# Patient Record
Sex: Female | Born: 1980 | Race: Black or African American | Hispanic: No | Marital: Married | Smoking: Never smoker
Health system: Southern US, Community
[De-identification: ages and names within clinical notes are randomized; demographics above are authoritative.]

## PROBLEM LIST (undated history)

## (undated) ENCOUNTER — Inpatient Hospital Stay (HOSPITAL_COMMUNITY): Payer: Self-pay

## (undated) DIAGNOSIS — D649 Anemia, unspecified: Secondary | ICD-10-CM

## (undated) HISTORY — PX: NO PAST SURGERIES: SHX2092

---
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
## 1999-12-27 ENCOUNTER — Inpatient Hospital Stay (HOSPITAL_COMMUNITY): Admission: AD | Admit: 1999-12-27 | Discharge: 1999-12-27 | Payer: Self-pay | Admitting: Obstetrics & Gynecology

## 2000-01-08 ENCOUNTER — Other Ambulatory Visit: Admission: RE | Admit: 2000-01-08 | Discharge: 2000-01-08 | Payer: Self-pay | Admitting: Obstetrics and Gynecology

## 2001-03-05 ENCOUNTER — Ambulatory Visit (HOSPITAL_COMMUNITY): Admission: AD | Admit: 2001-03-05 | Discharge: 2001-03-05 | Payer: Self-pay | Admitting: Obstetrics & Gynecology

## 2001-03-05 ENCOUNTER — Encounter: Payer: Self-pay | Admitting: Obstetrics & Gynecology

## 2007-10-15 ENCOUNTER — Inpatient Hospital Stay (HOSPITAL_COMMUNITY): Admission: AD | Admit: 2007-10-15 | Discharge: 2007-10-15 | Payer: Self-pay | Admitting: Obstetrics and Gynecology

## 2007-10-15 ENCOUNTER — Inpatient Hospital Stay (HOSPITAL_COMMUNITY): Admission: AD | Admit: 2007-10-15 | Discharge: 2007-10-19 | Payer: Self-pay | Admitting: Obstetrics and Gynecology

## 2010-04-06 ENCOUNTER — Emergency Department (HOSPITAL_COMMUNITY): Payer: Commercial Managed Care - PPO

## 2010-04-06 ENCOUNTER — Emergency Department (HOSPITAL_COMMUNITY)
Admission: EM | Admit: 2010-04-06 | Discharge: 2010-04-06 | Disposition: A | Discharge status: Home / Self Care | Payer: Commercial Managed Care - PPO | Attending: Emergency Medicine | Admitting: Emergency Medicine

## 2010-04-06 DIAGNOSIS — O99019 Anemia complicating pregnancy, unspecified trimester: Secondary | ICD-10-CM | POA: Insufficient documentation

## 2010-04-06 DIAGNOSIS — D649 Anemia, unspecified: Secondary | ICD-10-CM | POA: Insufficient documentation

## 2010-04-06 DIAGNOSIS — O208 Other hemorrhage in early pregnancy: Secondary | ICD-10-CM | POA: Insufficient documentation

## 2010-04-06 DIAGNOSIS — O200 Threatened abortion: Secondary | ICD-10-CM | POA: Insufficient documentation

## 2010-04-06 LAB — CBC
HCT: 33.5 % — ABNORMAL LOW (ref 36.0–46.0)
Hemoglobin: 11.3 g/dL — ABNORMAL LOW (ref 12.0–15.0)
MCH: 27 pg (ref 26.0–34.0)
MCHC: 33.7 g/dL (ref 30.0–36.0)
MCV: 80 fL (ref 78.0–100.0)
Platelets: 325 10*3/uL (ref 150–400)
RBC: 4.19 MIL/uL (ref 3.87–5.11)
RDW: 15.1 % (ref 11.5–15.5)
WBC: 7.9 10*3/uL (ref 4.0–10.5)

## 2010-04-06 LAB — GC/CHLAMYDIA PROBE AMP, GENITAL: Chlamydia, DNA Probe: NEGATIVE

## 2010-04-06 LAB — URINE MICROSCOPIC-ADD ON

## 2010-04-06 LAB — HCG, QUANTITATIVE, PREGNANCY: hCG, Beta Chain, Quant, S: 68796 m[IU]/mL — ABNORMAL HIGH (ref <5)

## 2010-04-06 LAB — WET PREP, GENITAL: Clue Cells Wet Prep HPF POC: NONE SEEN

## 2010-04-06 LAB — URINALYSIS, ROUTINE W REFLEX MICROSCOPIC
Bilirubin Urine: NEGATIVE
Ketones, ur: NEGATIVE mg/dL
Leukocytes, UA: NEGATIVE
Nitrite: NEGATIVE
Protein, ur: NEGATIVE mg/dL
Specific Gravity, Urine: 1.013 (ref 1.005–1.030)
Urine Glucose, Fasting: NEGATIVE mg/dL
Urobilinogen, UA: 0.2 mg/dL (ref 0.0–1.0)
pH: 5.5 (ref 5.0–8.0)

## 2010-04-06 LAB — POCT PREGNANCY, URINE: Preg Test, Ur: POSITIVE

## 2010-04-06 LAB — ABO/RH: ABO/RH(D): O POS

## 2010-04-08 ENCOUNTER — Inpatient Hospital Stay (HOSPITAL_COMMUNITY): Payer: Commercial Managed Care - PPO

## 2010-04-08 ENCOUNTER — Inpatient Hospital Stay (HOSPITAL_COMMUNITY)
Admission: AD | Admit: 2010-04-08 | Discharge: 2010-04-08 | Disposition: A | Discharge status: Home / Self Care | Payer: Commercial Managed Care - PPO | Source: Ambulatory Visit | Attending: Obstetrics & Gynecology | Admitting: Obstetrics & Gynecology

## 2010-04-08 ENCOUNTER — Other Ambulatory Visit: Payer: Self-pay | Admitting: Obstetrics and Gynecology

## 2010-04-08 DIAGNOSIS — O039 Complete or unspecified spontaneous abortion without complication: Secondary | ICD-10-CM | POA: Insufficient documentation

## 2010-04-08 LAB — CBC
HCT: 34.2 % — ABNORMAL LOW (ref 36.0–46.0)
Hemoglobin: 11.5 g/dL — ABNORMAL LOW (ref 12.0–15.0)
MCV: 80.3 fL (ref 78.0–100.0)
RBC: 4.26 MIL/uL (ref 3.87–5.11)
RDW: 15.3 % (ref 11.5–15.5)
WBC: 5.3 10*3/uL (ref 4.0–10.5)

## 2010-04-12 LAB — CROSSMATCH
Antibody Screen: NEGATIVE
Unit division: 0

## 2010-04-26 ENCOUNTER — Encounter: Payer: Commercial Managed Care - PPO | Admitting: Obstetrics and Gynecology

## 2010-04-26 ENCOUNTER — Encounter: Payer: Self-pay | Admitting: Obstetrics and Gynecology

## 2010-04-26 DIAGNOSIS — O039 Complete or unspecified spontaneous abortion without complication: Secondary | ICD-10-CM

## 2010-04-26 LAB — CONVERTED CEMR LAB: hCG, Beta Chain, Quant, S: 31.8 milliintl units/mL

## 2010-05-14 ENCOUNTER — Encounter (INDEPENDENT_AMBULATORY_CARE_PROVIDER_SITE_OTHER): Payer: Self-pay | Admitting: *Deleted

## 2010-05-14 ENCOUNTER — Other Ambulatory Visit: Payer: Commercial Managed Care - PPO

## 2010-05-14 DIAGNOSIS — Z0189 Encounter for other specified special examinations: Secondary | ICD-10-CM

## 2010-05-14 LAB — CONVERTED CEMR LAB: hCG, Beta Chain, Quant, S: <2 milliintl units/mL

## 2010-05-25 NOTE — Progress Notes (Unsigned)
Connie Benson, Connie Benson NO.:  [id number]  MEDICAL RECORD NO.:  [id number]           PATIENT TYPE:  A  LOCATION:  [facility]                   FACILITY:  [facility]  PHYSICIAN:  Connie Donovan, MD        DATE OF BIRTH:  Jun 12, 1980  DATE OF SERVICE:  04/26/2010                                 CLINIC NOTE  The patient is a 30 year old [place] African lady from [place] who has been in this country since she was 30 years old.  She was in the [facility] beginning of February and had a miscarriage spontaneous 9-week pregnancy and has had no bleeding since.  At that time, they started to give her a shot of Depo-Provera, but her intention is to get pregnant again.  She has one baby.  She is a gravida 2, para 1-0-1-1 and her other child is 16 years old.  She is on vitamins with folic acid.  I told her that she can start trying get pregnant after April 2012 when the Depo-Provera wears off and it looks as if her miscarriage was complete.  She did a Cytotec. I am going to get a quantitative beta on her today to make sure it is down to 0 and if not we will recommend getting another ultrasound. Interestingly enough, she was living in [place] with her parents, a Muslim and her father picked a husband for her who lived down here and that is why she moved down here about 10 years ago.  They were married for 8 years.  He told her that she was a barren woman because she had no children and did not get pregnant.  He wanted divorce that was granted and she has met her recent partner and the father of her baby with whom she has a very good relationship.  She is doing very well.  IMPRESSION:  Complete abortion.  PLAN:  Pregnancy soon.          ______________________________ Connie Donovan, MD    PR/MEDQ  D:  04/26/2010  T:  04/27/2010  Job:  [id number]

## 2010-07-17 NOTE — Op Note (Signed)
NAMECLAY, MENSER                ACCOUNT NO.:  [id number]   MEDICAL RECORD NO.:  [id number]          PATIENT TYPE:  INP   LOCATION:  9114                          [facility]   PHYSICIAN:  Hal Morales, M.D.DATE OF BIRTH:  05-Oct-1980   DATE OF PROCEDURE:  10/17/2007  DATE OF DISCHARGE:                               OPERATIVE REPORT   PREOPERATIVE DIAGNOSES:  1. Intrauterine pregnancy at term.  2. Premature rupture of membranes.  3. Prolonged second stage of labor.  4. Occiput posterior position.   POSTOPERATIVE DIAGNOSES:  1. Intrauterine pregnancy at term.  2. Premature rupture of membranes.  3. Prolonged second stage of labor.  4. Occiput posterior position.  5. Nuchal cord and body cord and meconium-stained fluid.   OPERATION:  Vacuum-assisted vaginal delivery over intact perineum,  repair of second-degree midline laceration.   SURGEON:  Hal Morales, MD   ANESTHESIA:  Epidural.   ESTIMATED BLOOD LOSS:  Less than 500 mL.   COMPLICATIONS:  None.   FINDINGS:  The patient was delivered of a female infant whose name is  Kassim, weighing 7 pounds 1 ounce with Apgars of 7 and 9 at 1 and 5  minutes respectively.   PROCEDURE:  The patient had been pushing in the lithotomy position for  over 2 hours.  She had push the baby down to +2 to +3 station but the  infant remained occiput posterior.  A discussion was held with the  patient concerning options for management of her labor.  These options  included continued pushing, cesarean section, and vacuum-assisted  vaginal delivery.  The risks of each of these options was outlined in  detail including the risks of anesthesia, bleeding, infection, damage to  adjacent organs for cesarean section and the risks of damage to maternal  tissues, cephalohematoma, inability to effect vaginal delivery and the  ability to effect delivery of the head and subsequent shoulder dystocia  sometimes requiring Zavanelli maneuver for the  vacuum assisted vaginal  delivery.  The risks of continued obstructive labor for continued  pushing was reviewed.  After consideration of the options, the risks and  benefits, the patient and her family decided they wanted to proceed with  an attempt at vacuum-assisted vaginal delivery.  With the patient in  lithotomy position, the perineum was prepped.  The bladder was emptied  with a red Robinson catheter.  A kiwi vacuum extractor was used over the  next 3 contractions to allow delivery of the fetal vertex in the occiput  posterior position over an intact perineum.  A loose nuchal cord was  reduced and the remainder of the infant delivered with a combination of  maternal expulsive efforts and gentle traction as the remainder of the  cord wrapped around the body was reduced.  The cord was clamped and cut  and the infant placed in the isolette.  The placenta spontaneously  separated and was expelled with maternal expulsive efforts.  It was  given to the employees of [employer] for cord blood  collection.  The midline laceration was repaired in  a layered fashion  with 2-0 Vicryl.  Ice packs were placed on the perineum.  The mother was  left in the labor delivery recovery area for initial infant bonding.  She did expressed a desire for circumcision for her son.  The risks of  anesthesia, bleeding and damage to the penis were explained to her and  she wishes to have that done at the appropriate time during the baby's  hospitalization.      Hal Morales, M.D.  Electronically Signed     VPH/MEDQ  D:  10/17/2007  T:  10/17/2007  Job:  [id number]

## 2010-07-17 NOTE — Discharge Summary (Signed)
Connie Benson, Connie Benson                ACCOUNT NO.:  [id number]   MEDICAL RECORD NO.:  [id number]          PATIENT TYPE:  INP   LOCATION:  9114                          [facility]   PHYSICIAN:  Janine Limbo, M.D.DATE OF BIRTH:  Jun 02, 1980   DATE OF ADMISSION:  10/15/2007  DATE OF DISCHARGE:  10/19/2007                               DISCHARGE SUMMARY   ADMITTING DIAGNOSES:  1. Intrauterine pregnancy at 39 weeks.  2. Premature rupture of membranes at term, very light meconium-stained      fluid.  3. Not in labor.   DISCHARGE DIAGNOSES:  1. Status post a vacuum assisted vaginal delivery by Dr. Pennie Rushing on      October 17, 2007 at 1:14 a.m.  Findings were a viable female infant by      the name of Kissim with Apgars 7 at 1 minute and 9 at 5 minutes and      weight of 7 pounds 1 ounce (which was 3210 g), length 21-1/2      inches.  She is breast-feeding and bottle feeding.  2. Some socioeconomic issues related to car seat and employment.  No      pay while on her leave as well as support from father of the baby      regarding finances.   HOSPITAL COURSE:  Ms. Frazier Butt is a 30 year old gravida 1, para 0 at 71  weeks' gestation who presented on the date of her admission which was  August the 13th with premature rupture of membranes which occurred at  approximately 2300.  She denied any contractions.  Reported good fetal  movement.  Previously had been followed by nurse midwife service at [facility]  and history was remarkable for:   1. Sickle cell trait.  2. First trimester bleeding.  3. Group beta strep negative.   She was admitted and offered the options of:   1. Expectant management versus:  2. Pitocin augmentation, and the patient initially elected for      expectant management.  Her cervix on admission was 2-3 cm, 90%, -3      in vertex.  At 4 a.m. on the 14th the patient was beginning to feel      more discomfort with contractions.  Her cervix at that time was 3-      1/2, 90%,  -2 vertex.  Fetal heart tracing was reassuring.      Contractions were irregular every 3-4 minutes.  Declined any pain      medicine at that time.  At 8:30 a.m. on the 14th she continued to      decline augmentation.  However, at that point had received 2 doses      of Stadol.  At 10 a.m. she was becoming tearful with her      contractions.  They were every 6-7 minutes.  Fetal heart rate      remained reassuring overall with occasional mild variable.  She      continued to decline epidural.  Plan was made to begin Pitocin      augmentation at noon if no  change.  Pitocin was started just after      12:15 secondary to no cervical change.  Later on that evening the      patient did decide to proceed with an epidural.  It had been      recommended to her earlier, but she had declined it at several      points.  Cervix at 5 p.m. was 5, 90 and -1.  Contractions were      every 2-6 minutes.  Occasional mild variable but CST remained      overall reassuring.  Her Pitocin was at 12 mU per minute.  At      approximately 6 p.m. on the 14th the patient was comfortable with      her epidural.  She did have an intrauterine pressure catheter and      fetal scalp electrode placed.  She was still having occasional very      mild variables.  Cervix was 5+, 80% vertex and +1.  Pitocin was on      14 mU per minute, and it was noted after [facility] put in that      [place] Units  were inadequate.  At approximately 8 o'clock on      the 14th the patient was sleeping.  No signs or symptoms of      distress or discomfort.  Vital signs remained stable.  Fetal heart      rate was in the 130s, moderate variability reactive around at the      last point at 1930 with a very occasional mild variable.  Uterine      contractions persisted every 1/2 to 4 minutes in the usual 130-170.      Since about 6:30 Pitocin was at 22 mU/minute.  Cervical exam was      deferred to allow patient to rest, and plan was to continue to       titrate Pitocin to achieve adequate MVU's greater than or equal to      180.  At 2201 the patient's cervix was complete and +2.  Fetal      heart rate was reassuring with moderate variability reactive, very      mild occasional variables.  Contractions were every 1-1/2 to 5      minutes on 28 mU of Pitocin.  The patient was without pressure or      urge to push, and plan was made to allow her to labor down for a      short time while awaiting her sister's arrival.  At approximately      15 after midnight the patient had been pushing since 10:45.  She      had great pushing efforts, but was becoming frustrated with minimal      progress over the last 30 minutes and sister was inquiring about      any assistance with delivery.  Notable part of fetal head was seen      at introitus with her pushing effort.  She did have some variables      with her contractions with pushing, but overall fetal heart tracing      was reassuring.  She was afebrile.  Vital signs were stable.  The      patient was +1 to +2.  The patient had pushed on both her left and      right sides in a flat supine position as well as utilizing      squatting  bar.  Pitocin was at 24 mU/minute and contractions were      every 2-3 minutes.  Dr. Pennie Rushing was called and asked to assess if      vacuum or forceps assisted delivery was appropriate.  OP      presentation was suspected.  The patient was left resting on her      left side while awaiting Dr. Lilian Coma arrival.  Dr. Pennie Rushing did      come in and assessed options for management of delivery.  Dr.      Pennie Rushing did offer the patient to proceed with:   1. Continued pushing without any assistance.  2. A vacuum assisted vaginal delivery.  3. Proceeding with a primary low transverse cesarean section.   Risk and benefits of all these options were discussed with patient, and  patient did elect for vacuum-assisted vaginal delivery.  Kiwi vacuum  extractor was utilized by Dr.  Pennie Rushing, and over the next 3 contractions  vacuum extraction delivery was performed by Dr. Pennie Rushing.  Fetal vertex  and occiput posterior position.  Delivery was at 1:14 on the 15th of  August.  A viable female infant by the name of Kissim weighing 7 pounds 1  ounce which was 3210 grams.  Apgars were 7 at 1 minute and 9 at 5  minutes.  Did have a loose nuchal cord and body cord which Dr. Pennie Rushing  delivered through.  The patient did have a 2nd degree midline laceration  which Dr. Pennie Rushing repaired.  Infant went to full-term nursery.  Patient  was doing well status post delivery in L&D recovery.   By postpartum day #1 the patient was doing well.  She was breast and  bottle feeding.  She was up ad lib.  Vital signs were stable.  She was  afebrile.  Hemoglobin was down to 10.7 from 13.  White count was up to  10.5 from 5.8, platelets were 210 down from 224.  Physical exam was  within normal limits.  Abdomen was soft, nontender.  Fundus was firm.  She had scant lochia.  Perineum was healing.  Plan was to continue  routine postpartum care.  The patient did receive a psychosocial  assessment from social work regarding patient not having a car seat.  She as well is not receiving any pay while she is on maternity leave.  Social work talked with patient regarding obtaining car seat, and the  patient was to see if family or her sister may have car seat for her to  borrow.  If not she would let the social work know she needed car seat  assistance if she could not afford one from the store secondary to  single parent and no income while on her maternity leave.  By postpartum  day #2 the patient was doing well, ready for discharge.  She continued  breast and bottle feeding.  She was also pumping.  She was up ad lib.  Pain was controlled with Motrin and p.r.n. Percocet.  She is undecided  on birth control, but has desire for next pregnancy in approximately 2  years.  She still voiced this morning that she  did not have a car seat  available, that her sister did not have any to give her, and the  patient's RN notified.  She is voiding without difficulty, tolerating  regular diet, and did request to have some additional pain medicine  besides the Motrin for her discharge.  Vital signs were still stable.  She was afebrile.  Physical exam was within normal limits.  Fundus was  firm below umbilicus.  Perineum healing.  Lochia small rubra.  Extremities within normal limits.  The patient was deemed to have  received full benefit of her hospital stay, and was discharged home in  stable condition.  Discharge follow-up to occur in 6 weeks or as needed  at [facility].   DISCHARGE MEDICATIONS:  1. Prenatal vitamin 1 tab p.o. daily.  2. Motrin 600 mg p.o. q.6 h. p.r.n. pain.  3. Vicodin 1-2 tabs p.o. q.6 h. p.r.n. moderate to severe pain.  She      was dispensed 30 with no refills.   Postpartum instructions were per [facility] pamphlet and warning signs and  symptoms report were reviewed.      Candice Denny Levy, CNM      ______________________________  Janine Limbo, M.D.    CHS/MEDQ  D:  10/19/2007  T:  10/19/2007  Job:  [id number]

## 2010-07-17 NOTE — H&P (Signed)
NAMERAYLENE, CARMICKLE                ACCOUNT NO.:  [id number]   MEDICAL RECORD NO.:  [id number]          PATIENT TYPE:  INP   LOCATION:  9168                          [facility]   PHYSICIAN:  Janine Limbo, M.D.DATE OF BIRTH:  [birth year]   DATE OF ADMISSION:  10/15/2007  DATE OF DISCHARGE:                              HISTORY & PHYSICAL   This is a 30 year old gravida 1, para 0, at 39-0/7 weeks who presents  with ruptured membranes at 2300 hours.  She denies contractions and  reports positive fetal movement.  Pregnancy has been followed by the  Nurse-Midwife Service and remarkable for:  1. Sickle cell trait.  2. First trimester bleeding.  3. Group B strep negative.   ALLERGIES:  None.   OB HISTORY:  None.   MEDICAL HISTORY:  None.   SURGICAL HISTORY:  Negative.   FAMILY HISTORY:  Remarkable for mother with hypertension, sister and  brother with anemia, and mother with asthma.   GENETIC HISTORY:  Negative.   SOCIAL HISTORY:  The patient is single.  Father of the baby is not  involved.  She is of the Muslim faith.  She denies any alcohol, tobacco,  or drug use.  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   PRENATAL LABS:  Hemoglobin 13.3, platelets 335, blood type O positive,  antibody screen negative, sickle cell positive for trait.  RPR non-  reactive.  Rubella immune.  Hepatitis negative.  HIV negative.  Gonorrhea negative.  Chlamydia negative.  Cystic fibrosis negative.   HISTORY OF CURRENT PREGNANCY:  The patient entered care at [redacted] weeks  gestation.  Had her new OB workup at 11 weeks.  She had her first  trimester screen that was normal.  She reported that the father of the  baby was negative for sickle cell trait.  She had some round ligament  pain at 26 weeks.  On ultrasound, her anatomy was normal.  She was group  B strep negative at term.   OBJECTIVE DATA:  VITAL SIGNS:  Stable, afebrile.  HEENT:  Within normal limits.  Thyroid normal,  not enlarged.  CHEST:  Clear to auscultation.  HEART:  Regular rate and rhythm.  ABDOMEN:  Gravid, vertex to Leopold's.  EFM shows nonreactive fetal  heart rate with occasional contractions.  CERVIX:  2-3 cm, 90% effaced, -3 station with a vertex presentation.  She is leaking large amounts of light yellow fluid.  EXTREMITIES:  Within normal limits.   ASSESSMENT:  1. Intrauterine pregnancy at 39-0/7 weeks.  2. Premature ruptured membranes at term, light meconium stained fluid.  3. Not in labor.   PLAN:  1. Admit per Dr. Stefano Gaul.  2. Discussed expected management versus Pitocin augmentation.  The      patient elects expected management for now.      Marie L. Mayford Knife, C.N.M.      ______________________________  Janine Limbo, M.D.    MLW/MEDQ  D:  10/16/2007  T:  10/16/2007  Job:  [id number]

## 2010-08-23 ENCOUNTER — Ambulatory Visit: Payer: Commercial Managed Care - PPO | Admitting: Obstetrics and Gynecology

## 2010-11-30 LAB — CBC
Hemoglobin: 13
MCHC: 33.4
Platelets: 224
RDW: 14.8

## 2010-12-17 ENCOUNTER — Inpatient Hospital Stay (HOSPITAL_COMMUNITY)
Admission: AD | Admit: 2010-12-17 | Discharge: 2010-12-17 | Disposition: A | Discharge status: Home / Self Care | Payer: Commercial Managed Care - PPO | Source: Ambulatory Visit | Attending: Obstetrics & Gynecology | Admitting: Obstetrics & Gynecology

## 2010-12-17 ENCOUNTER — Encounter (HOSPITAL_COMMUNITY): Payer: Self-pay

## 2010-12-17 DIAGNOSIS — Z3201 Encounter for pregnancy test, result positive: Secondary | ICD-10-CM | POA: Insufficient documentation

## 2010-12-17 HISTORY — DX: Anemia, unspecified: D64.9

## 2010-12-17 LAB — POCT PREGNANCY, URINE: Preg Test, Ur: POSITIVE

## 2010-12-17 NOTE — Progress Notes (Signed)
[facility] and need a confirmation pregnancy for Medicaid. Pt states she is not having any problems but was told to come to [facility] for confirmation.

## 2010-12-17 NOTE — ED Provider Notes (Signed)
Attestation of Attending Supervision of Advanced Practitioner: Evaluation and management procedures were performed by the PA/NP/CNM/OB Fellow under my supervision/collaboration. Chart reviewed and agree with management and plan.  Duchess Armendarez A 12/17/2010 12:04 PM

## 2010-12-17 NOTE — ED Provider Notes (Signed)
History     Chief Complaint  Patient presents with  . Possible Pregnancy   HPI Pt states [facility], was told that she should come here for pregnancy verification to apply for medicaid. No problems, no pain or bleeding. Patient's last menstrual period was 11/03/2010.  OB History    Grav Para Term Preterm Abortions TAB SAB Ect Mult Living   2 1 1  1  1   1       Past Medical History  Diagnosis Date  . Anemia     No past surgical history on file.  Family History  Problem Relation Age of Onset  . Hypertension Mother   . Asthma Mother     History  Substance Use Topics  . Smoking status: Never Smoker   . Smokeless tobacco: Not on file  . Alcohol Use: No    Allergies: Allergies not on file  No prescriptions prior to admission    Review of Systems  Constitutional: Negative.   Respiratory: Negative.   Cardiovascular: Negative.   Gastrointestinal: Negative.   Genitourinary: Negative.    Physical Exam   Blood pressure 126/85, pulse 79, temperature 98.2 F (36.8 C), resp. rate 20, height 5\' 6"  (1.676 m), weight 126.281 kg (278 lb 6.4 oz), last menstrual period 11/03/2010, SpO2 99.00%.  Physical Exam  Constitutional: She is oriented to person, place, and time. She appears well-developed and well-nourished. No distress.  Cardiovascular: Normal rate.   Respiratory: Effort normal.  Musculoskeletal: Normal range of motion.  Neurological: She is alert and oriented to person, place, and time.  Psychiatric: She has a normal mood and affect.    MAU Course  Procedures  Results for orders placed during the hospital encounter of 12/17/10 (from the past 24 hour(s))  POCT PREGNANCY, URINE     Status: Normal   Collection Time   12/17/10  9:52 AM      Component Value Range   Preg Test, Ur POSITIVE       Assessment and Plan  Pregnancy verification letter given Follow up for prenatal care asap  [facility] 12/17/2010, 9:53 AM

## 2011-02-12 ENCOUNTER — Other Ambulatory Visit (HOSPITAL_COMMUNITY): Payer: Self-pay | Admitting: Family

## 2011-02-12 DIAGNOSIS — Z3689 Encounter for other specified antenatal screening: Secondary | ICD-10-CM

## 2011-02-12 LAB — OB RESULTS CONSOLE HIV ANTIBODY (ROUTINE TESTING): HIV: NONREACTIVE

## 2011-02-12 LAB — OB RESULTS CONSOLE GC/CHLAMYDIA
Chlamydia: NEGATIVE
Gonorrhea: NEGATIVE

## 2011-03-05 NOTE — L&D Delivery Note (Signed)
Attestation of Attending Supervision of Resident: Evaluation and management procedures were performed by the [facility] Resident under my supervision.  I have seen and examined the patient, reviewed the resident's note and chart, and I agree with management and plan.   Jaynie Collins, M.D. 08/16/2011 3:10 PM

## 2011-03-05 NOTE — L&D Delivery Note (Signed)
Delivery Note At 3:43 AM a viable female was delivered via Vaginal, Spontaneous Delivery (Presentation: Left Occiput Anterior).  APGAR: 8, 9; weight 6 lb 4.9 oz (2860 g).   Placenta status: Intact, Spontaneous.  Cord: 3 vessels with the following complications: None.    Anesthesia: Epidural  Episiotomy: None Lacerations: 1st degree, hemostatic, did not require repair Est. Blood Loss (mL): 250  Mom to postpartum.  Baby to nursery-stable.  Cam Hai, CNM present for delivery.  oat-judge, Ishaq Maffei 07/24/2011, 3:59 AM

## 2011-03-06 ENCOUNTER — Inpatient Hospital Stay (HOSPITAL_COMMUNITY)
Admission: AD | Admit: 2011-03-06 | Discharge: 2011-03-06 | Disposition: A | Discharge status: Home / Self Care | Payer: Commercial Managed Care - PPO | Source: Ambulatory Visit | Attending: Obstetrics & Gynecology | Admitting: Obstetrics & Gynecology

## 2011-03-06 ENCOUNTER — Encounter (HOSPITAL_COMMUNITY): Payer: Self-pay | Admitting: *Deleted

## 2011-03-06 DIAGNOSIS — N949 Unspecified condition associated with female genital organs and menstrual cycle: Secondary | ICD-10-CM

## 2011-03-06 DIAGNOSIS — R109 Unspecified abdominal pain: Secondary | ICD-10-CM | POA: Insufficient documentation

## 2011-03-06 DIAGNOSIS — O99891 Other specified diseases and conditions complicating pregnancy: Secondary | ICD-10-CM | POA: Insufficient documentation

## 2011-03-06 LAB — URINALYSIS, ROUTINE W REFLEX MICROSCOPIC
Bilirubin Urine: NEGATIVE
Glucose, UA: NEGATIVE mg/dL
Ketones, ur: NEGATIVE mg/dL
Nitrite: NEGATIVE
Protein, ur: NEGATIVE mg/dL
pH: 6 (ref 5.0–8.0)

## 2011-03-06 NOTE — ED Notes (Signed)
E. Rice at the bedside

## 2011-03-06 NOTE — ED Provider Notes (Signed)
History   Pt is currently 17.4 wks by LMP. She presents today c/o abd cramping that began last pm. She denies recent intercourse, vag dc, bleeding, or any other sx. She states she became concerned because it is "too early to be feeling these pains."  No chief complaint on file.  HPI  OB History    Grav Para Term Preterm Abortions TAB SAB Ect Mult Living   2 1 1  1  1   1       Past Medical History  Diagnosis Date  . Anemia     No past surgical history on file.  Family History  Problem Relation Age of Onset  . Hypertension Mother   . Asthma Mother     History  Substance Use Topics  . Smoking status: Never Smoker   . Smokeless tobacco: Not on file  . Alcohol Use: No    Allergies: Allergies not on file  No prescriptions prior to admission    Review of Systems  Constitutional: Negative for fever.  Eyes: Negative for blurred vision.  Cardiovascular: Negative for chest pain and palpitations.  Gastrointestinal: Positive for abdominal pain. Negative for nausea, vomiting, diarrhea and constipation.  Genitourinary: Negative for dysuria, urgency, frequency and hematuria.  Neurological: Negative for dizziness and headaches.  Psychiatric/Behavioral: Negative for depression and suicidal ideas.   Physical Exam   There were no vitals taken for this visit.  Physical Exam  Nursing note and vitals reviewed. Constitutional: She is oriented to person, place, and time. She appears well-developed and well-nourished. No distress.  HENT:  Head: Normocephalic and atraumatic.  Eyes: EOM are normal. Pupils are equal, round, and reactive to light.  GI: Soft. She exhibits no distension. There is no tenderness. There is no rebound and no guarding.  Genitourinary: No bleeding around the vagina. No vaginal discharge found.       Cervix Lg/closed. No vag dc or bleeding noted on exam.  Neurological: She is alert and oriented to person, place, and time.  Skin: Skin is warm and dry. She is not  diaphoretic.  Psychiatric: She has a normal mood and affect. Her behavior is normal. Judgment and thought content normal.    MAU Course  Procedures  Results for orders placed during the hospital encounter of 03/06/11 (from the past 24 hour(s))  URINALYSIS, ROUTINE W REFLEX MICROSCOPIC     Status: Normal   Collection Time   03/06/11  4:45 AM      Component Value Range   Color, Urine YELLOW  YELLOW    APPearance CLEAR  CLEAR    Specific Gravity, Urine 1.015  1.005 - 1.030    pH 6.0  5.0 - 8.0    Glucose, UA NEGATIVE  NEGATIVE (mg/dL)   Hgb urine dipstick NEGATIVE  NEGATIVE    Bilirubin Urine NEGATIVE  NEGATIVE    Ketones, ur NEGATIVE  NEGATIVE (mg/dL)   Protein, ur NEGATIVE  NEGATIVE (mg/dL)   Urobilinogen, UA 0.2  0.0 - 1.0 (mg/dL)   Nitrite NEGATIVE  NEGATIVE    Leukocytes, UA NEGATIVE  NEGATIVE       Assessment and Plan  Round ligament pain: discussed with pt at length. Pt to f/u with her OB provider. Discussed diet, activity, risks,and precautions.  Clinton Gallant. Rice III, DrHSc, MPAS, PA-C  03/06/2011, 4:53 AM   Henrietta Hoover, PA 03/06/11 0505

## 2011-03-06 NOTE — Progress Notes (Signed)
Pt G3 P1 LMP 11/03/2010, having abd pain since last night, denies bleeding or discharge.

## 2011-03-19 ENCOUNTER — Ambulatory Visit (HOSPITAL_COMMUNITY)
Admission: RE | Admit: 2011-03-19 | Discharge: 2011-03-19 | Disposition: A | Discharge status: Home / Self Care | Payer: Commercial Managed Care - PPO | Source: Ambulatory Visit | Attending: Family | Admitting: Family

## 2011-03-19 DIAGNOSIS — O358XX0 Maternal care for other (suspected) fetal abnormality and damage, not applicable or unspecified: Secondary | ICD-10-CM | POA: Insufficient documentation

## 2011-03-19 DIAGNOSIS — Z3689 Encounter for other specified antenatal screening: Secondary | ICD-10-CM

## 2011-03-19 DIAGNOSIS — Z363 Encounter for antenatal screening for malformations: Secondary | ICD-10-CM | POA: Insufficient documentation

## 2011-03-19 DIAGNOSIS — Z1389 Encounter for screening for other disorder: Secondary | ICD-10-CM | POA: Insufficient documentation

## 2011-06-16 ENCOUNTER — Encounter (HOSPITAL_COMMUNITY): Payer: Self-pay | Admitting: Obstetrics and Gynecology

## 2011-06-16 ENCOUNTER — Inpatient Hospital Stay (HOSPITAL_COMMUNITY)
Admission: AD | Admit: 2011-06-16 | Discharge: 2011-06-16 | Disposition: A | Discharge status: Home / Self Care | Payer: Commercial Managed Care - PPO | Source: Ambulatory Visit | Attending: Obstetrics & Gynecology | Admitting: Obstetrics & Gynecology

## 2011-06-16 DIAGNOSIS — O219 Vomiting of pregnancy, unspecified: Secondary | ICD-10-CM

## 2011-06-16 DIAGNOSIS — O212 Late vomiting of pregnancy: Secondary | ICD-10-CM | POA: Insufficient documentation

## 2011-06-16 LAB — URINALYSIS, ROUTINE W REFLEX MICROSCOPIC
Bilirubin Urine: NEGATIVE
Hgb urine dipstick: NEGATIVE
Ketones, ur: NEGATIVE mg/dL
Leukocytes, UA: NEGATIVE
Nitrite: NEGATIVE
Specific Gravity, Urine: 1.015 (ref 1.005–1.030)
pH: 6 (ref 5.0–8.0)

## 2011-06-16 MED ORDER — ONDANSETRON HCL 4 MG PO TABS: 4.0000 mg | ORAL_TABLET | Freq: Three times a day (TID) | ORAL | Status: AC | PRN

## 2011-06-16 MED ORDER — RANITIDINE HCL 150 MG PO TABS: 150.0000 mg | ORAL_TABLET | Freq: Two times a day (BID) | ORAL | Status: DC

## 2011-06-16 NOTE — Discharge Instructions (Signed)
Heartburn During Pregnancy  Heartburn is a burning sensation in the chest caused by stomach acid backing up into the esophagus. Heartburn (also known as "reflux") is common in pregnancy because a certain hormone (progesterone) changes. The progesterone hormone may relax the valve that separates the esophagus from the stomach. This allows acid to go up into the esophagus, causing heartburn. Heartburn may also happen in pregnancy because the enlarging uterus pushes up on the stomach, which pushes more acid into the esophagus. This is especially true in the later stages of pregnancy. Heartburn problems usually go away after giving birth. CAUSES   The progesterone hormone.   Changing hormone levels.   The growing uterus that pushes stomach acid upward.   Large meals.   Certain foods and drinks.   Exercise.   Increased acid production.  SYMPTOMS   Burning pain in the chest or lower throat.   Bitter taste in the mouth.   Coughing.  DIAGNOSIS  Heartburn is typically diagnosed by your caregiver when taking a careful history of your concern. Your caregiver may order a blood test to check for a certain type of bacteria that is associated with heartburn. Sometimes, heartburn is diagnosed by prescribing a heartburn medicine to see if the symptoms improve. It is rare in pregnancy to have a procedure called an endoscopy. This is when a tube with a light and a camera on the end is used to examine the esophagus and the stomach. TREATMENT   Your caregiver may tell you to use certain over-the-counter medicines (antacids, acid reducers) for mild heartburn.   Your caregiver may prescribe medicines to decrease stomach acid or to protect your stomach lining.   Your caregiver may recommend certain diet changes.   For severe cases, your caregiver may recommend that the head of the bed be elevated on blocks. (Sleeping with more pillows is not an effective treatment as it only changes the position of your  head and does not improve the main problem of stomach acid refluxing into the esophagus.)  HOME CARE INSTRUCTIONS   Take all medicines as directed by your caregiver.   Raise the head of your bed by putting blocks under the legs if instructed to by your caregiver.   Do not exercise right after eating.   Avoid eating 2 or 3 hours before bed. Do not lie down right after eating.   Eat small meals throughout the day instead of 3 large meals.   Identify foods and beverages that make your symptoms worse and avoid them. Foods you may want to avoid include:   Peppers.   Chocolate.   High-fat foods, including fried foods.   Spicy foods.   Garlic and onions.   Citrus fruits, including oranges, grapefruit, lemons, and limes.   Food containing tomatoes or tomato products.   Mint.   Carbonated and caffeinated drinks.   Vinegar.  SEEK IMMEDIATE MEDICAL CARE IF:   You have severe chest pain that goes down your arm or into your jaw or neck.   You feel sweaty, dizzy, or lightheaded.   You become short of breath.   You vomit blood.   You have difficulty or pain with swallowing.   You have bloody or black, tarry stools.   You have episodes of heartburn more than 3 times a week, for more than 2 weeks.  MAKE SURE YOU:  Understand these instructions.   Will watch your condition.   Will get help right away if you are not doing well or   get worse.  Document Released: 02/16/2000 Document Revised: 02/07/2011 Document Reviewed: 08/09/2010 Exit[employer] Patient Information 2012 [employer]. 

## 2011-06-16 NOTE — MAU Provider Note (Signed)
History     CSN: [id number]  Arrival date and time: 06/16/11 1624   First Provider Initiated Contact with Patient 06/16/11 1717      Chief Complaint  Patient presents with  . Emesis   HPI 31 yo G3p1011 here at 32.1 weeks with complaint of vomiting.  States she began having nausea and vomiting today at work and was told she needed to come to the MAU.   She feels good now, with no feeling of nausea.  Emesis was stomach contents only, Non-bloody, non-bilious.  She has had some acid reflux over the past few days.  She denies fever, diarrhea, vaginal bleeding/discharge, cough, congestion, contractions/abdominal pain.  OB History    Grav Para Term Preterm Abortions TAB SAB Ect Mult Living   3 1 1  1  1   1       Past Medical History  Diagnosis Date  . Anemia     History reviewed. No pertinent past surgical history.  Family History  Problem Relation Age of Onset  . Hypertension Mother   . Asthma Mother     History  Substance Use Topics  . Smoking status: Never Smoker   . Smokeless tobacco: Not on file  . Alcohol Use: No    Allergies: No Known Allergies  Prescriptions prior to admission  Medication Sig Dispense Refill  . Prenatal Vit-Fe Fumarate-FA (PRENATAL MULTIVITAMIN) TABS Take 1 tablet by mouth daily.          Review of Systems  Constitutional: Negative for fever.  Eyes: Negative for blurred vision.  Respiratory: Negative for shortness of breath.   Cardiovascular: Negative for chest pain.  Gastrointestinal: Positive for heartburn, nausea and vomiting. Negative for abdominal pain.  Genitourinary: Negative for dysuria, urgency and frequency.  Musculoskeletal: Negative for back pain.  Neurological: Negative for headaches.   Physical Exam   Blood pressure 121/67, pulse 87, temperature 98.6 F (37 C), temperature source Oral, height 5\' 6"  (1.676 m), weight 118.661 kg (261 lb 9.6 oz), last menstrual period 11/03/2010.  Physical Exam  Constitutional: She is  oriented to person, place, and time. She appears well-nourished. No distress.  HENT:  Mouth/Throat: Oropharynx is clear and moist.  Neck: Neck supple.  Cardiovascular: Normal rate and regular rhythm.   Respiratory: Effort normal and breath sounds normal.  GI: She exhibits no distension (Gravid). There is no tenderness. There is no guarding.  Musculoskeletal: She exhibits no edema.  Lymphadenopathy:    She has no cervical adenopathy.  Neurological: She is alert and oriented to person, place, and time.   UC: Not-present NST:  Baseline: 140 with moderate variability.  Accels: present, Decels: Absent Cat I tracing.  MAU Course  Procedures  MDM Results for orders placed during the hospital encounter of 06/16/11 (from the past 24 hour(s))  URINALYSIS, ROUTINE W REFLEX MICROSCOPIC     Status: Normal   Collection Time   06/16/11  4:45 PM      Component Value Range   Color, Urine YELLOW  YELLOW    APPearance CLEAR  CLEAR    Specific Gravity, Urine 1.015  1.005 - 1.030    pH 6.0  5.0 - 8.0    Glucose, UA NEGATIVE  NEGATIVE (mg/dL)   Hgb urine dipstick NEGATIVE  NEGATIVE    Bilirubin Urine NEGATIVE  NEGATIVE    Ketones, ur NEGATIVE  NEGATIVE (mg/dL)   Protein, ur NEGATIVE  NEGATIVE (mg/dL)   Urobilinogen, UA 0.2  0.0 - 1.0 (mg/dL)   Nitrite NEGATIVE  NEGATIVE    Leukocytes, UA NEGATIVE  NEGATIVE      Assessment and Plan  1. Emesis during pregnancy  -Improved, likely result of reflux  -Will give rx for zantac and zofran  -F/u [facility] for current scheduled appointment.   Yoceline Bazar 06/16/2011, 5:24 PM

## 2011-06-16 NOTE — MAU Note (Signed)
Pt reports she was as work and started vomiting. Stated she has chills as well.

## 2011-06-16 NOTE — MAU Provider Note (Signed)
Medical Screening exam and patient care preformed by advanced practice provider.  Agree with the above management.  

## 2011-07-16 LAB — OB RESULTS CONSOLE GBS: GBS: POSITIVE

## 2011-07-23 ENCOUNTER — Encounter (HOSPITAL_COMMUNITY): Payer: Self-pay

## 2011-07-23 ENCOUNTER — Inpatient Hospital Stay (HOSPITAL_COMMUNITY)
Admission: AD | Admit: 2011-07-23 | Discharge: 2011-07-26 | DRG: 775 | Disposition: A | Discharge status: Home / Self Care | Payer: Commercial Managed Care - PPO | Source: Ambulatory Visit | Attending: Obstetrics & Gynecology | Admitting: Obstetrics & Gynecology

## 2011-07-23 DIAGNOSIS — O99214 Obesity complicating childbirth: Secondary | ICD-10-CM | POA: Diagnosis present

## 2011-07-23 DIAGNOSIS — Z22330 Carrier of Group B streptococcus: Secondary | ICD-10-CM

## 2011-07-23 DIAGNOSIS — O99892 Other specified diseases and conditions complicating childbirth: Secondary | ICD-10-CM | POA: Diagnosis present

## 2011-07-23 DIAGNOSIS — O4290 Premature rupture of membranes, unspecified as to length of time between rupture and onset of labor, unspecified weeks of gestation: Principal | ICD-10-CM | POA: Diagnosis present

## 2011-07-23 DIAGNOSIS — Z6841 Body Mass Index (BMI) 40.0 and over, adult: Secondary | ICD-10-CM

## 2011-07-23 DIAGNOSIS — E669 Obesity, unspecified: Secondary | ICD-10-CM | POA: Diagnosis present

## 2011-07-23 LAB — CBC
HCT: 33.5 % — ABNORMAL LOW (ref 36.0–46.0)
Platelets: 222 10*3/uL (ref 150–400)
RBC: 3.94 MIL/uL (ref 3.87–5.11)
RDW: 14.2 % (ref 11.5–15.5)
WBC: 5.5 10*3/uL (ref 4.0–10.5)

## 2011-07-23 MED ORDER — PHENYLEPHRINE 40 MCG/ML (10ML) SYRINGE FOR IV PUSH (FOR BLOOD PRESSURE SUPPORT)
80.0000 ug | PREFILLED_SYRINGE | INTRAVENOUS | Status: DC | PRN
  Filled 2011-07-23: qty 2

## 2011-07-23 MED ORDER — ACETAMINOPHEN 325 MG PO TABS: 650.0000 mg | ORAL_TABLET | ORAL | Status: DC | PRN

## 2011-07-23 MED ORDER — OXYTOCIN BOLUS FROM INFUSION
500.0000 mL | Freq: Once | INTRAVENOUS | Status: DC
  Filled 2011-07-23: qty 500

## 2011-07-23 MED ORDER — EPHEDRINE 5 MG/ML INJ
10.0000 mg | INTRAVENOUS | Status: DC | PRN
  Filled 2011-07-23: qty 4
  Filled 2011-07-23: qty 2

## 2011-07-23 MED ORDER — OXYTOCIN 20 UNITS IN LACTATED RINGERS INFUSION - SIMPLE
1.0000 m[IU]/min | INTRAVENOUS | Status: DC
  Administered 2011-07-23: 2 m[IU]/min via INTRAVENOUS
  Filled 2011-07-23: qty 1000

## 2011-07-23 MED ORDER — LACTATED RINGERS IV SOLN
INTRAVENOUS | Status: DC
  Administered 2011-07-23 – 2011-07-24 (×3): via INTRAVENOUS

## 2011-07-23 MED ORDER — CITRIC ACID-SODIUM CITRATE 334-500 MG/5ML PO SOLN: 30.0000 mL | ORAL | Status: DC | PRN

## 2011-07-23 MED ORDER — PRENATAL MULTIVITAMIN CH: 1.0000 | ORAL_TABLET | Freq: Every day | ORAL | Status: DC

## 2011-07-23 MED ORDER — LIDOCAINE HCL (PF) 1 % IJ SOLN
30.0000 mL | INTRAMUSCULAR | Status: DC | PRN
  Filled 2011-07-23: qty 30

## 2011-07-23 MED ORDER — OXYTOCIN 20 UNITS IN LACTATED RINGERS INFUSION - SIMPLE
125.0000 mL/h | Freq: Once | INTRAVENOUS | Status: AC
  Administered 2011-07-24: 999 mL/h via INTRAVENOUS

## 2011-07-23 MED ORDER — MISOPROSTOL 25 MCG QUARTER TABLET
25.0000 ug | ORAL_TABLET | Freq: Once | ORAL | Status: AC
  Administered 2011-07-23: 25 ug via VAGINAL
  Filled 2011-07-23: qty 0.25

## 2011-07-23 MED ORDER — ONDANSETRON HCL 4 MG/2ML IJ SOLN: 4.0000 mg | Freq: Four times a day (QID) | INTRAMUSCULAR | Status: DC | PRN

## 2011-07-23 MED ORDER — IBUPROFEN 600 MG PO TABS: 600.0000 mg | ORAL_TABLET | Freq: Four times a day (QID) | ORAL | Status: DC | PRN

## 2011-07-23 MED ORDER — OXYCODONE-ACETAMINOPHEN 5-325 MG PO TABS: 1.0000 | ORAL_TABLET | ORAL | Status: DC | PRN

## 2011-07-23 MED ORDER — PENICILLIN G POTASSIUM 5000000 UNITS IJ SOLR
5.0000 10*6.[IU] | Freq: Once | INTRAVENOUS | Status: AC
  Administered 2011-07-23: 5 10*6.[IU] via INTRAVENOUS
  Filled 2011-07-23: qty 5

## 2011-07-23 MED ORDER — TERBUTALINE SULFATE 1 MG/ML IJ SOLN: 0.2500 mg | Freq: Once | INTRAMUSCULAR | Status: AC | PRN

## 2011-07-23 MED ORDER — FLEET ENEMA 7-19 GM/118ML RE ENEM: 1.0000 | ENEMA | RECTAL | Status: DC | PRN

## 2011-07-23 MED ORDER — PHENYLEPHRINE 40 MCG/ML (10ML) SYRINGE FOR IV PUSH (FOR BLOOD PRESSURE SUPPORT)
80.0000 ug | PREFILLED_SYRINGE | INTRAVENOUS | Status: DC | PRN
  Filled 2011-07-23: qty 2
  Filled 2011-07-23: qty 5

## 2011-07-23 MED ORDER — LACTATED RINGERS IV SOLN: 500.0000 mL | Freq: Once | INTRAVENOUS | Status: DC

## 2011-07-23 MED ORDER — EPHEDRINE 5 MG/ML INJ
10.0000 mg | INTRAVENOUS | Status: DC | PRN
  Filled 2011-07-23: qty 2

## 2011-07-23 MED ORDER — FENTANYL 2.5 MCG/ML BUPIVACAINE 1/10 % EPIDURAL INFUSION (WH - ANES)
14.0000 mL/h | INTRAMUSCULAR | Status: DC
  Filled 2011-07-23: qty 60

## 2011-07-23 MED ORDER — DIPHENHYDRAMINE HCL 50 MG/ML IJ SOLN: 12.5000 mg | INTRAMUSCULAR | Status: DC | PRN

## 2011-07-23 MED ORDER — FAMOTIDINE 20 MG PO TABS: 20.0000 mg | ORAL_TABLET | Freq: Two times a day (BID) | ORAL | Status: DC

## 2011-07-23 MED ORDER — MISOPROSTOL 25 MCG QUARTER TABLET
25.0000 ug | ORAL_TABLET | ORAL | Status: DC | PRN
  Filled 2011-07-23: qty 1

## 2011-07-23 MED ORDER — PENICILLIN G POTASSIUM 5000000 UNITS IJ SOLR
2.5000 10*6.[IU] | INTRAMUSCULAR | Status: DC
  Administered 2011-07-23 – 2011-07-24 (×6): 2.5 10*6.[IU] via INTRAVENOUS
  Filled 2011-07-23 (×9): qty 2.5

## 2011-07-23 MED ORDER — LACTATED RINGERS IV SOLN: 500.0000 mL | INTRAVENOUS | Status: DC | PRN

## 2011-07-23 NOTE — H&P (Signed)
Connie Benson is a 31 y.o. G3P1011 @ 37'3wks by LMP/1st tri [place] presented with chief complaint LOF, clear, around 0230 today.  No ctx/abd/back pain.  No VB.  +FM.  No HA, CP, SOB, RUQ pain, vision changes, hand/face swelling.  Prenatal Issues: - care with [facility] - dating: LMP 11/03/10 gives EDD 08/10/11, consistent with 1st trimester [place] - h/o VAVD - sickle cell trait (Hgb AS) - GBS positive - Obesity, BMI 45 - measured S<D with normal [place]  History OB History    Grav Para Term Preterm Abortions TAB SAB Ect Mult Living   3 1 1  1  1   1   VAVD at term after PROM and 3 day labor   Past Medical History  Diagnosis Date  . History of anemia    Past Surgical History  Procedure Date  . No past surgeries    Family History: family history includes Asthma in her mother and Hypertension in her mother.  There is no history of Anesthesia problems. Social History:  reports that she has never smoked. She does not have any smokeless tobacco history on file. She reports that she does not drink alcohol or use illicit drugs.  Review of Systems  Constitutional: Negative for fever and chills.  HENT: Negative for sore throat.   Eyes: Negative for blurred vision and double vision.  Respiratory: Negative for cough and wheezing.   Cardiovascular: Negative for chest pain and palpitations.  Gastrointestinal: Negative for vomiting and diarrhea.  Genitourinary: Negative for dysuria and urgency.  Musculoskeletal: Negative for myalgias and joint pain.  Skin: Negative for itching and rash.  Neurological: Negative for dizziness, seizures and headaches.  Endo/Heme/Allergies: Negative for polydipsia. Does not bruise/bleed easily.  Psychiatric/Behavioral: Negative for depression. The patient is not nervous/anxious.    Blood pressure 118/75, pulse 89, temperature 97.4 F (36.3 C), temperature source Oral, resp. rate 18, last menstrual period 11/03/2010. Exam Physical Exam  Constitutional: No distress.    HENT:  Head: Normocephalic and atraumatic.  Eyes: Conjunctivae and EOM are normal.  Neck: Normal range of motion. Neck supple.  Cardiovascular: Normal rate and regular rhythm.   Respiratory: Effort normal and breath sounds normal.  GI:       Gravid, EFW 7.5lb, no RUQ tenderness.  Genitourinary: Vagina normal.  Musculoskeletal: Normal range of motion.       Non-pitting edema bilateral feet  Skin: Skin is warm and dry. She is not diaphoretic.  Psychiatric: She has a normal mood and affect. Her behavior is normal.   Dilation: 1.5 Effacement (%): 50 Station: -3 Exam by:: Dr. Graylin Shiver Vertex Grossly ruptured, fern positive. EFM: baseline 140, moderate variability, positive accels, rare, brief variables, spontaneously resolve Toco: q 8-9 min   Prenatal labs: ABO, Rh:  O+ Antibody:  neg Rubella: Immune (12/11 0000) RPR: Nonreactive (12/11 0000)  HBsAg: Negative (12/11 0000)  HIV: Non-reactive (12/11 0000)  GBS: Positive (05/14 0000)  Sickle trait: Hemoglobin AS GTT records not clear in chart  Assessment/Plan: Connie Benson is a 31 y.o. G3P1011 @ 37'3wks by LMP/1st tri [place] being admitted with PROM.  -PROM: with GBS positive status, on PCN.  No signs of labor.  Given unfavorable cervix, will start ripening with misoprostol PV.  Once cervix adequately ripe, will proceed with pitocin. -Pain: desires natural labor -FWB: GBS positive, on PCN, vtx, cat 1 tracing except for brief episode of cat 2 due to brief variables that spontaneously resolved.  Will continue with cont EFM.  Chancy Hurter  MD 07/23/2011, 5:57 AM   Saw pt and agree Wilhelmina Hark 07/23/2011 6:37 AM .

## 2011-07-23 NOTE — Progress Notes (Signed)
Subjective: Pt has no complaints.  Not feeling any ctx.  No VB.  +FM.  Objective: BP 126/75  Pulse 87  Temp(Src) 97.9 F (36.6 C) (Oral)  Resp 20  Ht 5\' 6"  (1.676 m)  Wt 119.75 kg (264 lb)  BMI 42.61 kg/m2  LMP 11/03/2010   FHT: baseline 135, moderate variability, postive accels, very rare, brief variables that spontaneously resolve, otherwise no decels UC:   Rare, 10 min SVE:  3/50/high/soft/posterior/very difficult exam  Labs: RPR NR  Assessment / Plan: Cleta Heatley is a 31 y.o. G3P1011 @ 37'3wks by LMP/1st tri [place] being admitted with PROM.  -PROM: with GBS positive status, on PCN.  No signs of labor upon admission with unfavorable cervix, s/p misoprostol PV x 1 dose at 0630 then foley bulb in at 1300 and out at 1900.  Will start pitcoin now.  -Pain: desires natural labor  -FWB: GBS positive, on PCN, vtx, cat 1 tracing except for brief episode of cat 2 due to brief variables that spontaneously resolved. Will continue with cont EFM.  Chancy Hurter MD  07/23/2011, 8:51 PM

## 2011-07-23 NOTE — H&P (Signed)
Attestation of Attending Supervision of Advanced Practitioner: Evaluation and management procedures were performed by the [facility] Fellow/PA/CNM/NP under my supervision and collaboration. Chart reviewed, and agree with management and plan.  Jaynie Collins, M.D. 07/23/2011 7:58 AM

## 2011-07-23 NOTE — Progress Notes (Signed)
Connie Benson is a 31 y.o. G3P1011 at [redacted]w[redacted]d   Subjective: Mostly comfortable  Objective: BP 113/72  Pulse 78  Temp(Src) 98.2 F (36.8 C) (Oral)  Resp 20  Ht 5\' 6"  (1.676 m)  Wt 119.75 kg (264 lb)  BMI 42.61 kg/m2  LMP 11/03/2010 I/O last 3 completed shifts: In: 250 [IV Piggyback:250] Out: -     FHT:  FHR: 140 bpm, variability: moderate,  accelerations:  Present,  decelerations:  Present occ mi variables, questionable if with a ctx since toco difficult to trace and ctx so rare UC:   Irregular and rare SVE:   Dilation: 2 Effacement (%): 50 Station: -3 Exam by:: Dr.  Pincus Badder CNM; foley bulb placed using speculum  Labs: Lab Results  Component Value Date   WBC 5.5 07/23/2011   HGB 11.5* 07/23/2011   HCT 33.5* 07/23/2011   MCV 85.0 07/23/2011   PLT 222 07/23/2011    Assessment / Plan: PROM x 11 hrs Afebrile  Leave foley bulb in place for now  Cam Hai 07/23/2011, 12:59 PM

## 2011-07-24 ENCOUNTER — Encounter (HOSPITAL_COMMUNITY): Payer: Self-pay | Admitting: Anesthesiology

## 2011-07-24 ENCOUNTER — Inpatient Hospital Stay (HOSPITAL_COMMUNITY): Payer: Commercial Managed Care - PPO | Admitting: Anesthesiology

## 2011-07-24 ENCOUNTER — Encounter (HOSPITAL_COMMUNITY): Payer: Self-pay | Admitting: *Deleted

## 2011-07-24 DIAGNOSIS — O99214 Obesity complicating childbirth: Secondary | ICD-10-CM

## 2011-07-24 DIAGNOSIS — E669 Obesity, unspecified: Secondary | ICD-10-CM

## 2011-07-24 DIAGNOSIS — O9989 Other specified diseases and conditions complicating pregnancy, childbirth and the puerperium: Secondary | ICD-10-CM

## 2011-07-24 DIAGNOSIS — O4290 Premature rupture of membranes, unspecified as to length of time between rupture and onset of labor, unspecified weeks of gestation: Secondary | ICD-10-CM

## 2011-07-24 MED ORDER — OXYCODONE-ACETAMINOPHEN 5-325 MG PO TABS
1.0000 | ORAL_TABLET | ORAL | Status: DC | PRN
  Filled 2011-07-24: qty 1

## 2011-07-24 MED ORDER — BUTORPHANOL TARTRATE 2 MG/ML IJ SOLN
1.0000 mg | Freq: Once | INTRAMUSCULAR | Status: AC
  Administered 2011-07-24: 1 mg via INTRAVENOUS
  Filled 2011-07-24: qty 1

## 2011-07-24 MED ORDER — SIMETHICONE 80 MG PO CHEW: 80.0000 mg | CHEWABLE_TABLET | ORAL | Status: DC | PRN

## 2011-07-24 MED ORDER — ONDANSETRON HCL 4 MG PO TABS: 4.0000 mg | ORAL_TABLET | ORAL | Status: DC | PRN

## 2011-07-24 MED ORDER — ONDANSETRON HCL 4 MG/2ML IJ SOLN: 4.0000 mg | INTRAMUSCULAR | Status: DC | PRN

## 2011-07-24 MED ORDER — DIBUCAINE 1 % RE OINT: 1.0000 "application " | TOPICAL_OINTMENT | RECTAL | Status: DC | PRN

## 2011-07-24 MED ORDER — ZOLPIDEM TARTRATE 5 MG PO TABS: 5.0000 mg | ORAL_TABLET | Freq: Every evening | ORAL | Status: DC | PRN

## 2011-07-24 MED ORDER — IBUPROFEN 600 MG PO TABS
600.0000 mg | ORAL_TABLET | Freq: Four times a day (QID) | ORAL | Status: DC
  Administered 2011-07-24 – 2011-07-26 (×8): 600 mg via ORAL
  Filled 2011-07-24 (×9): qty 1

## 2011-07-24 MED ORDER — BENZOCAINE-MENTHOL 20-0.5 % EX AERO: 1.0000 "application " | INHALATION_SPRAY | CUTANEOUS | Status: DC | PRN

## 2011-07-24 MED ORDER — FENTANYL 2.5 MCG/ML BUPIVACAINE 1/10 % EPIDURAL INFUSION (WH - ANES)
INTRAMUSCULAR | Status: DC | PRN
  Administered 2011-07-24: 14 mL/h via EPIDURAL

## 2011-07-24 MED ORDER — LANOLIN HYDROUS EX OINT: TOPICAL_OINTMENT | CUTANEOUS | Status: DC | PRN

## 2011-07-24 MED ORDER — DIPHENHYDRAMINE HCL 25 MG PO CAPS: 25.0000 mg | ORAL_CAPSULE | Freq: Four times a day (QID) | ORAL | Status: DC | PRN

## 2011-07-24 MED ORDER — PRENATAL MULTIVITAMIN CH
1.0000 | ORAL_TABLET | Freq: Every day | ORAL | Status: DC
  Administered 2011-07-24 – 2011-07-26 (×3): 1 via ORAL
  Filled 2011-07-24 (×2): qty 1

## 2011-07-24 MED ORDER — SODIUM BICARBONATE 8.4 % IV SOLN
INTRAVENOUS | Status: DC | PRN
  Administered 2011-07-24: 4 mL via EPIDURAL

## 2011-07-24 MED ORDER — WITCH HAZEL-GLYCERIN EX PADS: 1.0000 "application " | MEDICATED_PAD | CUTANEOUS | Status: DC | PRN

## 2011-07-24 MED ORDER — FENTANYL CITRATE 0.05 MG/ML IJ SOLN: 100.0000 ug | INTRAMUSCULAR | Status: DC | PRN

## 2011-07-24 MED ORDER — SENNOSIDES-DOCUSATE SODIUM 8.6-50 MG PO TABS
2.0000 | ORAL_TABLET | Freq: Every day | ORAL | Status: DC
  Administered 2011-07-24 – 2011-07-25 (×2): 2 via ORAL

## 2011-07-24 MED ORDER — TETANUS-DIPHTH-ACELL PERTUSSIS 5-2.5-18.5 LF-MCG/0.5 IM SUSP
0.5000 mL | Freq: Once | INTRAMUSCULAR | Status: AC
  Administered 2011-07-25: 0.5 mL via INTRAMUSCULAR
  Filled 2011-07-24: qty 0.5

## 2011-07-24 NOTE — Progress Notes (Signed)
I have seen and examined this patient and I agree with the above. Cam Hai 3:59 AM 07/24/2011

## 2011-07-24 NOTE — Anesthesia Procedure Notes (Signed)

## 2011-07-24 NOTE — Progress Notes (Signed)
No vag exam per Dr. Venida Jarvis

## 2011-07-24 NOTE — Progress Notes (Signed)
Subjective: Pt growing increasingly uncomfortable with ctx since pit started.  No VB.  +FM.  Objective: BP 101/66  Pulse 72  Temp(Src) 97.4 F (36.3 C) (Oral)  Resp 18  Ht 5\' 6"  (1.676 m)  Wt 119.75 kg (264 lb)  BMI 42.61 kg/m2  LMP 11/03/2010  FHT: baseline 130, moderate variability, postive accels, no decels UC:   q2-20min since 22:30 SVE:   Dilation: 5 Effacement (%): 70 Station: -3 Exam by:: [employer]: No new labs.  Assessment / Plan: Connie Benson is a 31 y.o. G3P1011 @ 37'4wks by LMP/1st tri [place] being admitted with PROM.  -PROM: with GBS positive status, on PCN.  No signs of labor upon admission with unfavorable cervix, s/p misoprostol PV x 1 dose at 0630 then foley bulb in at 1300 and out at 1900.  On pitcoin since 2120, currently at 8, with appropriate cervical change since last check.  Cont to titrate pitocin and limit vaginal exams. -Pain: desires epidural now. -FWB: GBS positive, on PCN, vtx, cat 1 tracing.  Chancy Hurter MD 07/24/2011, 1:53 AM

## 2011-07-24 NOTE — Progress Notes (Signed)
I have seen and examined this patient and I agree with the above. Cam Hai 3:58 AM 07/24/2011

## 2011-07-24 NOTE — Anesthesia Postprocedure Evaluation (Signed)
  Anesthesia Post-op Note  Patient: Connie Benson  Procedure(s) Performed: * No procedures listed *  Patient Location: Mother/Baby  Anesthesia Type: Epidural  Level of Consciousness: awake and alert   Airway and Oxygen Therapy: Patient Spontanous Breathing  Post-op Pain: none  Post-op Assessment: Patient's Cardiovascular Status Stable, Respiratory Function Stable, Patent Airway, No signs of Nausea or vomiting, Adequate PO intake, Pain level controlled, No headache, No backache, No residual numbness and No residual motor weakness  Post-op Vital Signs: Reviewed and stable  Complications: No apparent anesthesia complications

## 2011-07-24 NOTE — Anesthesia Preprocedure Evaluation (Signed)
Anesthesia Evaluation  Patient identified by MRN, date of birth, ID band Patient awake    Reviewed: Allergy & Precautions, H&P , Patient's Chart, lab work & pertinent test results  Airway Mallampati: III  TM Distance: >3 FB Neck ROM: full    Dental  (+) Teeth Intact   Pulmonary  breath sounds clear to auscultation        Cardiovascular Rhythm:regular Rate:Normal     Neuro/Psych    GI/Hepatic   Endo/Other  Morbid obesity  Renal/GU      Musculoskeletal   Abdominal   Peds  Hematology   Anesthesia Other Findings       Reproductive/Obstetrics (+) Pregnancy                             Anesthesia Physical Anesthesia Plan  ASA: III  Anesthesia Plan: Epidural   Post-op Pain Management:    Induction:   Airway Management Planned:   Additional Equipment:   Intra-op Plan:   Post-operative Plan:   Informed Consent: I have reviewed the patients History and Physical, chart, labs and discussed the procedure including the risks, benefits and alternatives for the proposed anesthesia with the patient or authorized representative who has indicated his/her understanding and acceptance.   Dental Advisory Given  Plan Discussed with:   Anesthesia Plan Comments: (Labs checked- platelets confirmed with RN in room. Fetal heart tracing, per RN, reported to be stable enough for sitting procedure. Discussed epidural, and patient consents to the procedure:  included risk of possible headache,backache, failed block, allergic reaction, and nerve injury. This patient was asked if she had any questions or concerns before the procedure started.)        Anesthesia Quick Evaluation  

## 2011-07-24 NOTE — Progress Notes (Signed)
Pt to mbw rm 145. Report given

## 2011-07-25 NOTE — Progress Notes (Signed)
Post Partum Day 1 Subjective: up ad lib.  Doing well. 1 episode of uterine pain during the night pain and c/o pain at epidural site- Controlled with Ibuprofen.  Objective: Blood pressure 106/68, pulse 65, temperature 98.3 F (36.8 C), temperature source Oral, resp. rate 18, height 5\' 6"  (1.676 m), weight 119.75 kg (264 lb), last menstrual period 11/03/2010, SpO2 100.00%, unknown if currently breastfeeding.  Physical Exam:  General: alert and cooperative Lochia: appropriate Uterine Fundus: Unable appreciate d/t body habitus  DVT Evaluation: No evidence of DVT seen on physical exam. Negative Homan's sign. No cords or calf tenderness.   Basename 07/23/11 0334  HGB 11.5*  HCT 33.5*    Assessment/Plan: Plan for discharge tomorrow   LOS: 2 days   Anders Simmonds 07/25/2011, 7:23 AM    I have seen and examined this patient and agree the above assessment. CRESENZO-DISHMAN,Jonpaul Lumm 07/25/2011 7:42 AM

## 2011-07-26 MED ORDER — IBUPROFEN 600 MG PO TABS: 600.0000 mg | ORAL_TABLET | Freq: Four times a day (QID) | ORAL | Status: AC

## 2011-07-26 NOTE — Discharge Instructions (Signed)
Vaginal Delivery Care After  Change your pad on each trip to the bathroom.   Wipe gently with toilet paper during your hospital stay. Always wipe from front to back. A spray bottle with warm tap water could also be used or a towelette if available.   Place your soiled pad and toilet paper in a bathroom wastebasket with a plastic bag liner.   During your hospital stay, save any clots. If you pass a clot while on the toilet, do not flush it. Also, if your vaginal flow seems excessive to you, notify nursing personnel.   The first time you get out of bed after delivery, wait for assistance from a nurse. Do not get up alone at any time if you feel weak or dizzy.   Bend and extend your ankles forcefully so that you feel the calves of your legs get hard. Do this 6 times every hour when you are in bed and awake.   Do not sit with one foot under you, dangle your legs over the edge of the bed, or maintain a position that hinders the circulation in your legs.   Many women experience after pains for 2 to 3 days after delivery. These after pains are mild uterine contractions. Ask the nurse for a pain medication if you need something for this. Sometimes breastfeeding stimulates after pains; if you find this to be true, ask for the medication  -  hour before the next feeding.   For you and your infant's protection, do not go beyond the door(s) of the obstetric unit. Do not carry your baby in your arms in the hallway. When taking your baby to and from your room, put your baby in the bassinet and push the bassinet.   Mothers may have their babies in their room as much as they desire.  Document Released: 02/16/2000 Document Revised: 02/07/2011 Document Reviewed: 01/16/2007 Exit[employer] Patient Information 2012 [employer]. 

## 2011-07-26 NOTE — Discharge Summary (Signed)
Obstetric Discharge Summary Reason for Admission: rupture of membranes Prenatal Procedures: none Intrapartum Procedures: spontaneous vaginal delivery Postpartum Procedures: none Complications-Operative and Postpartum: 1st degree perineal laceration Hemoglobin  Date Value Range Status  07/23/2011 11.5* 12.0-15.0 (g/dL) Final     HCT  Date Value Range Status  07/23/2011 33.5* 36.0-46.0 (%) Final    Physical Exam:  General: alert, cooperative and no distress Lochia: appropriate Uterine Fundus: firm DVT Evaluation: No evidence of DVT seen on physical exam. Negative Homan's sign. No cords or calf tenderness. No significant calf/ankle edema.  Discharge Diagnoses: Term Pregnancy-delivered  Discharge Information: Date: 07/26/2011 Activity: pelvic rest Diet: routine Medications: PNV and Ibuprofen Condition: stable Instructions: refer to practice specific booklet Discharge to: home Follow-up Information    Follow up with [facility] [place] in 6 weeks.   Contact information:   [address] [employer] [place] [address]          Newborn Data: Live born female  Birth Weight: 6 lb 4.9 oz (2860 g) APGAR: 8, 9  Home with mother.  Connie Benson JEHIEL 07/26/2011, 8:00 AM

## 2012-03-04 NOTE — L&D Delivery Note (Signed)
Delivery Note At 11:45 AM a viable female was delivered via precipitous Spontaneous Vaginal Delivery (Presentation: ; Occiput Posterior).  APGAR: 8, 9; weight 7 lb 9.9 oz (3455 g).   Patient delivered as soon as she was put in her bed on L&D, fetal head was out before my arrival and I completed the delivery. Placenta status: Intact, Spontaneous.  Cord: 3 vessels with the following complications: Loose true knot noted  Anesthesia: None  Episiotomy: None Lacerations: 1st degree;Perineal Suture Repair: 3.0 vicryl rapide Est. Blood Loss (mL): 300  Mom to postpartum.  Baby to nursery-stable.  Tereso Newcomer, M.D 12/04/2012, 12:34 PM

## 2012-07-23 ENCOUNTER — Other Ambulatory Visit (HOSPITAL_COMMUNITY): Payer: Self-pay | Admitting: Physician Assistant

## 2012-07-23 DIAGNOSIS — Z3689 Encounter for other specified antenatal screening: Secondary | ICD-10-CM

## 2012-07-23 LAB — OB RESULTS CONSOLE HIV ANTIBODY (ROUTINE TESTING): HIV: NONREACTIVE

## 2012-07-23 LAB — OB RESULTS CONSOLE RPR: RPR: NONREACTIVE

## 2012-08-04 ENCOUNTER — Ambulatory Visit (HOSPITAL_COMMUNITY)
Admission: RE | Admit: 2012-08-04 | Discharge: 2012-08-04 | Disposition: A | Discharge status: Home / Self Care | Payer: Commercial Managed Care - PPO | Source: Ambulatory Visit | Attending: Physician Assistant | Admitting: Physician Assistant

## 2012-08-04 DIAGNOSIS — Z363 Encounter for antenatal screening for malformations: Secondary | ICD-10-CM | POA: Insufficient documentation

## 2012-08-04 DIAGNOSIS — O358XX0 Maternal care for other (suspected) fetal abnormality and damage, not applicable or unspecified: Secondary | ICD-10-CM | POA: Insufficient documentation

## 2012-08-04 DIAGNOSIS — Z3689 Encounter for other specified antenatal screening: Secondary | ICD-10-CM

## 2012-08-04 DIAGNOSIS — Z1389 Encounter for screening for other disorder: Secondary | ICD-10-CM | POA: Insufficient documentation

## 2012-10-30 ENCOUNTER — Encounter: Payer: Self-pay | Admitting: *Deleted

## 2012-12-04 ENCOUNTER — Encounter (HOSPITAL_COMMUNITY): Payer: Self-pay

## 2012-12-04 ENCOUNTER — Inpatient Hospital Stay (HOSPITAL_COMMUNITY)
Admission: AD | Admit: 2012-12-04 | Discharge: 2012-12-06 | DRG: 775 | Disposition: A | Discharge status: Home / Self Care | Payer: Commercial Managed Care - PPO | Source: Ambulatory Visit | Attending: Obstetrics & Gynecology | Admitting: Obstetrics & Gynecology

## 2012-12-04 DIAGNOSIS — O9989 Other specified diseases and conditions complicating pregnancy, childbirth and the puerperium: Secondary | ICD-10-CM

## 2012-12-04 DIAGNOSIS — Z22330 Carrier of Group B streptococcus: Secondary | ICD-10-CM

## 2012-12-04 DIAGNOSIS — O99892 Other specified diseases and conditions complicating childbirth: Secondary | ICD-10-CM | POA: Diagnosis present

## 2012-12-04 LAB — CBC
MCH: 29.1 pg (ref 26.0–34.0)
MCHC: 34.9 g/dL (ref 30.0–36.0)
MCV: 83.3 fL (ref 78.0–100.0)
Platelets: 256 10*3/uL (ref 150–400)
RBC: 4.37 MIL/uL (ref 3.87–5.11)
RDW: 14.7 % (ref 11.5–15.5)

## 2012-12-04 LAB — TYPE AND SCREEN
ABO/RH(D): O POS
Antibody Screen: NEGATIVE

## 2012-12-04 LAB — OB RESULTS CONSOLE GC/CHLAMYDIA: Chlamydia: NEGATIVE

## 2012-12-04 MED ORDER — TETANUS-DIPHTH-ACELL PERTUSSIS 5-2.5-18.5 LF-MCG/0.5 IM SUSP: 0.5000 mL | Freq: Once | INTRAMUSCULAR | Status: DC

## 2012-12-04 MED ORDER — LIDOCAINE HCL (PF) 1 % IJ SOLN
INTRAMUSCULAR | Status: AC
  Administered 2012-12-04: 30 mL
  Filled 2012-12-04: qty 30

## 2012-12-04 MED ORDER — ONDANSETRON HCL 4 MG/2ML IJ SOLN: 4.0000 mg | INTRAMUSCULAR | Status: DC | PRN

## 2012-12-04 MED ORDER — OXYCODONE-ACETAMINOPHEN 5-325 MG PO TABS
1.0000 | ORAL_TABLET | ORAL | Status: DC | PRN
  Administered 2012-12-04: 1 via ORAL
  Filled 2012-12-04: qty 1

## 2012-12-04 MED ORDER — BENZOCAINE-MENTHOL 20-0.5 % EX AERO: 1.0000 "application " | INHALATION_SPRAY | CUTANEOUS | Status: DC | PRN

## 2012-12-04 MED ORDER — OXYTOCIN 40 UNITS IN LACTATED RINGERS INFUSION - SIMPLE MED
INTRAVENOUS | Status: AC
  Administered 2012-12-04: 999 mL/h via INTRAVENOUS
  Filled 2012-12-04: qty 1000

## 2012-12-04 MED ORDER — LACTATED RINGERS IV SOLN: INTRAVENOUS | Status: DC

## 2012-12-04 MED ORDER — SENNOSIDES-DOCUSATE SODIUM 8.6-50 MG PO TABS
2.0000 | ORAL_TABLET | Freq: Every day | ORAL | Status: DC
  Administered 2012-12-05: 2 via ORAL

## 2012-12-04 MED ORDER — OXYTOCIN BOLUS FROM INFUSION: 500.0000 mL | INTRAVENOUS | Status: DC

## 2012-12-04 MED ORDER — ONDANSETRON HCL 4 MG PO TABS: 4.0000 mg | ORAL_TABLET | ORAL | Status: DC | PRN

## 2012-12-04 MED ORDER — OXYTOCIN 40 UNITS IN LACTATED RINGERS INFUSION - SIMPLE MED
62.5000 mL/h | INTRAVENOUS | Status: DC
  Administered 2012-12-04: 999 mL/h via INTRAVENOUS

## 2012-12-04 MED ORDER — WITCH HAZEL-GLYCERIN EX PADS: 1.0000 "application " | MEDICATED_PAD | CUTANEOUS | Status: DC | PRN

## 2012-12-04 MED ORDER — ZOLPIDEM TARTRATE 5 MG PO TABS: 5.0000 mg | ORAL_TABLET | Freq: Every evening | ORAL | Status: DC | PRN

## 2012-12-04 MED ORDER — OXYCODONE-ACETAMINOPHEN 5-325 MG PO TABS: 1.0000 | ORAL_TABLET | ORAL | Status: DC | PRN

## 2012-12-04 MED ORDER — INFLUENZA VAC SPLIT QUAD 0.5 ML IM SUSP
0.5000 mL | INTRAMUSCULAR | Status: AC
  Administered 2012-12-05: 0.5 mL via INTRAMUSCULAR
  Filled 2012-12-04: qty 0.5

## 2012-12-04 MED ORDER — IBUPROFEN 600 MG PO TABS
600.0000 mg | ORAL_TABLET | Freq: Four times a day (QID) | ORAL | Status: DC
  Administered 2012-12-04 – 2012-12-06 (×8): 600 mg via ORAL
  Filled 2012-12-04 (×6): qty 1

## 2012-12-04 MED ORDER — DIBUCAINE 1 % RE OINT: 1.0000 "application " | TOPICAL_OINTMENT | RECTAL | Status: DC | PRN

## 2012-12-04 MED ORDER — LACTATED RINGERS IV SOLN: 500.0000 mL | INTRAVENOUS | Status: DC | PRN

## 2012-12-04 MED ORDER — LANOLIN HYDROUS EX OINT: TOPICAL_OINTMENT | CUTANEOUS | Status: DC | PRN

## 2012-12-04 MED ORDER — MEASLES, MUMPS & RUBELLA VAC ~~LOC~~ INJ
0.5000 mL | INJECTION | Freq: Once | SUBCUTANEOUS | Status: DC
  Filled 2012-12-04: qty 0.5

## 2012-12-04 MED ORDER — PRENATAL MULTIVITAMIN CH
1.0000 | ORAL_TABLET | Freq: Every day | ORAL | Status: DC
  Administered 2012-12-05 – 2012-12-06 (×2): 1 via ORAL
  Filled 2012-12-04 (×2): qty 1

## 2012-12-04 MED ORDER — SIMETHICONE 80 MG PO CHEW: 80.0000 mg | CHEWABLE_TABLET | ORAL | Status: DC | PRN

## 2012-12-04 MED ORDER — DIPHENHYDRAMINE HCL 25 MG PO CAPS: 25.0000 mg | ORAL_CAPSULE | Freq: Four times a day (QID) | ORAL | Status: DC | PRN

## 2012-12-04 MED ORDER — IBUPROFEN 600 MG PO TABS
600.0000 mg | ORAL_TABLET | Freq: Four times a day (QID) | ORAL | Status: DC | PRN
  Administered 2012-12-04 – 2012-12-05 (×2): 600 mg via ORAL
  Filled 2012-12-04 (×3): qty 1

## 2012-12-04 NOTE — H&P (Signed)
Connie Benson is a 32 y.o. female presenting for SROM at home. Pt is having frequent painful contractions and is unable to give me additional information at this time. Infant had a prolonged decel that recovered prior to my arrival to MAU.  +VB, +CTX, +LOF, +FM . History OB History   Grav Para Term Preterm Abortions TAB SAB Ect Mult Living   4 2 2  1  1   2      Past Medical History  Diagnosis Date  . Anemia    Past Surgical History  Procedure Laterality Date  . No past surgeries     Family History: family history includes Asthma in her mother; Hypertension in her mother. There is no history of Anesthesia problems. Social History:  reports that she has never smoked. She does not have any smokeless tobacco history on file. She reports that she does not drink alcohol or use illicit drugs.   ROS  Unable to obtain  Dilation: 7 Effacement (%): 70;80 Station: -1 Exam by:: Dr. Ike Bene not currently breastfeeding. Maternal Exam:  Uterine Assessment: Contraction strength is firm.  Contraction duration is 2 minutes. Contraction frequency is regular.   Abdomen: Patient reports no abdominal tenderness. Fetal presentation: vertex  Introitus: Normal vulva. Normal vagina.  Amniotic fluid character: clear.  Pelvis: adequate for delivery.   Cervix: Cervix evaluated by digital exam.     Fetal Exam Fetal Monitor Review: Mode: ultrasound.   Baseline rate: 150s-160s.  Variability: minimal (<5 bpm).   Pattern: no accelerations and prolonged decelerations.    Fetal State Assessment: Category II - tracings are indeterminate.     Physical Exam  Prenatal labs: ABO, Rh:   Antibody:   Rubella:   RPR: Nonreactive (05/22 0000)  HBsAg:    HIV: Non-reactive (05/22 0000)  GBS: Positive (09/22 0000)   Assessment/Plan: Connie Benson is a 32 y.o. [id number] at [redacted]w[redacted]d L=23 in active labor #Labor: expectant managment #Pain: Natural birth #FWB: Cat II #ID: GBS +, giving ampicillin now. If able  will give PCN in 4hrs #MOC: desires BTL  Connie Benson 12/04/2012, 11:38 AM

## 2012-12-04 NOTE — MAU Note (Signed)
C/o ucs that started a couple hours ago and SROM @ 0930;

## 2012-12-04 NOTE — MAU Note (Signed)
Pt states went to MD for eval of bloody mucus, was told is normal, awoke this am with more mucus discharge, having pain/pressure q5 minutes.

## 2012-12-04 NOTE — MAU Note (Signed)
Difficult to manage pt due to pain and rapid labor; FHR back up to 155 but minimal variabilty and only 5x5 accels ; + SROM with clear fluid @ 0930; GBS +; admitting pt to Room 162;

## 2012-12-05 LAB — CBC
MCH: 29.5 pg (ref 26.0–34.0)
Platelets: 271 10*3/uL (ref 150–400)
RBC: 4.31 MIL/uL (ref 3.87–5.11)
RDW: 14.8 % (ref 11.5–15.5)
WBC: 13.5 10*3/uL — ABNORMAL HIGH (ref 4.0–10.5)

## 2012-12-05 NOTE — Lactation Note (Signed)
This note was copied from the chart of Boy Kema Bobby. Lactation Consultation Note: Initial visit with mom. She is experienced BF mom. Reports that her nipples are sore. Reviewed wide open mouth and keeping the baby close to the breast throughout the feeding. Baby was circ'd this afternoon and is very sleepy. Encouraged to call for assist at next feeding. BF brochure given with resources for support after DC. No questions at present  Patient Name: Boy Cleotilde Spadaccini [id number] Date: 12/05/2012 Reason for consult: Initial assessment   Maternal Data Formula Feeding for Exclusion: Yes Reason for exclusion: Mother's choice to formula and breast feed on admission Does the patient have breastfeeding experience prior to this delivery?: Yes  Feeding Feeding Type: Breast Milk Length of feed: 20 min  LATCH Score/Interventions       Type of Nipple: Everted at rest and after stimulation  Comfort (Breast/Nipple): Filling, red/small blisters or bruises, mild/mod discomfort  Problem noted: Mild/Moderate discomfort        Lactation Tools Discussed/Used     Consult Status Consult Status: Follow-up Date: 12/05/12 Follow-up type: In-patient    Pamelia Hoit 12/05/2012, 4:03 PM

## 2012-12-05 NOTE — Progress Notes (Addendum)
Post Partum Day #1 Subjective: no complaints and up ad lib; breastfeeding going well; unsure re contraception; desires circ for infant  Objective: Blood pressure 119/72, pulse 79, temperature 97.9 F (36.6 C), temperature source Oral, resp. rate 17, height 5\' 6"  (1.676 m), weight 121.564 kg (268 lb), SpO2 100.00%, not currently breastfeeding.  Physical Exam:  General: alert, cooperative and no distress Lochia: appropriate Uterine Fundus: firm DVT Evaluation: No evidence of DVT seen on physical exam.   Recent Labs  12/04/12 1404 12/05/12 0628  HGB 12.7 12.7  HCT 36.4 36.0    Assessment/Plan: Plan for discharge tomorrow and Circumcision prior to discharge Pt with positive GBS status and precip delivery with no abx- peds will want to keep x 48 hours.   LOS: 1 day   Cam Hai 12/05/2012, 8:35 AM

## 2012-12-06 MED ORDER — IBUPROFEN 600 MG PO TABS: 600.0000 mg | ORAL_TABLET | Freq: Four times a day (QID) | ORAL | Status: DC

## 2012-12-06 NOTE — Lactation Note (Signed)
This note was copied from the chart of Connie Benson. Lactation Consultation Note: Follow up visit with mom before DC. Mom reports that baby has been nursing much better and her nipples are getting better. Mom reports that breasts are feeling a little fuller this morning. Has given some bottles of formula during the night. Reviewed engorgement prevention and treatment. RN has given mom a manual pump- she reports that she used it with her daughter. #27 and #30 flange given due to mom's large nipples. She reports that the last time she used that pump it hurt. No further questions at present. To call prn  Patient Name: Connie Milani Lowenstein [id number] Date: 12/06/2012 Reason for consult: Follow-up assessment   Maternal Data    Feeding   LATCH Score/Interventions          Comfort (Breast/Nipple): Filling, red/small blisters or bruises, mild/mod discomfort  Problem noted: Mild/Moderate discomfort        Lactation Tools Discussed/Used     Consult Status Consult Status: Complete    Pamelia Hoit 12/06/2012, 8:39 AM

## 2012-12-06 NOTE — Discharge Summary (Signed)
Attestation of Attending Supervision of Advanced Practitioner (CNM/NP): Evaluation and management procedures were performed by the Advanced Practitioner under my supervision and collaboration. I have reviewed the Advanced Practitioner's note and chart, and I agree with the management and plan.  Sandar Krinke H. 12:57 PM   

## 2012-12-06 NOTE — Discharge Summary (Signed)
Obstetric Discharge Summary Reason for Admission: rupture of membranes/active labor Prenatal Procedures: none Intrapartum Procedures: spontaneous vaginal delivery Postpartum Procedures: none Complications-Operative and Postpartum: 1st degree perineal laceration Hemoglobin  Date Value Range Status  12/05/2012 12.7  12.0 - 15.0 g/dL Final     HCT  Date Value Range Status  12/05/2012 36.0  36.0 - 46.0 % Final    Physical Exam:  General: alert, cooperative and no distress, walking around room, appeared in good spirits Lochia: appropriate Uterine Fundus: firm Incision: n/a DVT Evaluation: No evidence of DVT seen on physical exam.  Discharge Diagnoses: Term Pregnancy-delivered  Discharge Information: Date: 12/06/2012 Activity: unrestricted Diet: routine Medications: Ibuprofen Condition: stable Instructions: refer to practice specific booklet Discharge to: home Follow-up Information   Follow up with [facility] [place]. (at 4-6 weeks for postpartum visit)    Contact information:   [address] [place] [address] [phone]      Newborn Data: Live born female  Birth Weight: 7 lb 9.9 oz (3455 g) APGAR: 8, 9  Home with mother  Connie Benson is a 32 y.o F who presented for SROM at home and freq painful contractions. Plan was to give amp for GBS prophylaxis given rapid progression of labor. However patient already with fetal head out as she was brought to L&D. She delivered a viable female via precipitous SVD. Delivery was complicated by a loose true knot in 3 vessel cord. 1st degree lac was repaired with 3.0 vicryl. EBL 300. Pt did quite well, was already breast feeding infant well prior to d/c. Infant was watched closely given post GBS status and lack of abx with rapid delivery. Pt plans to continue breast feeding and desires paraguard IUD for contraception. Will f/up at health department at routine postpartum visit.  Anselm Lis 12/06/2012, 9:22 AM  I have seen and  examined this patient and agree with above documentation in the resident's note.   Rulon Abide, M.D. [facility] Fellow 12/06/2012 11:16 AM

## 2012-12-10 NOTE — H&P (Signed)
Attestation of Attending Supervision of Obstetric Fellow: Evaluation and management procedures were performed by the Obstetric Fellow under my supervision and collaboration.  I have reviewed the Obstetric Fellow's note and chart, and I agree with the management and plan.  Laniece Hornbaker, MD, FACOG Attending Obstetrician & Gynecologist Faculty Practice, [facility]   

## 2013-09-27 ENCOUNTER — Emergency Department (HOSPITAL_COMMUNITY)
Admission: EM | Admit: 2013-09-27 | Discharge: 2013-09-27 | Disposition: A | Discharge status: Home / Self Care | Payer: Commercial Managed Care - PPO | Attending: Emergency Medicine | Admitting: Emergency Medicine

## 2013-09-27 DIAGNOSIS — L0501 Pilonidal cyst with abscess: Secondary | ICD-10-CM | POA: Insufficient documentation

## 2013-09-27 DIAGNOSIS — D649 Anemia, unspecified: Secondary | ICD-10-CM | POA: Insufficient documentation

## 2013-09-27 DIAGNOSIS — R51 Headache: Secondary | ICD-10-CM | POA: Insufficient documentation

## 2013-09-27 MED ORDER — ACETAMINOPHEN 325 MG PO TABS
650.0000 mg | ORAL_TABLET | Freq: Once | ORAL | Status: AC
  Administered 2013-09-27: 650 mg via ORAL
  Filled 2013-09-27: qty 2

## 2013-09-27 MED ORDER — SULFAMETHOXAZOLE-TRIMETHOPRIM 800-160 MG PO TABS: 1.0000 | ORAL_TABLET | Freq: Two times a day (BID) | ORAL | Status: DC

## 2013-09-27 MED ORDER — CEPHALEXIN 500 MG PO CAPS: 500.0000 mg | ORAL_CAPSULE | Freq: Four times a day (QID) | ORAL | Status: DC

## 2013-09-27 MED ORDER — ONDANSETRON HCL 4 MG PO TABS: 4.0000 mg | ORAL_TABLET | Freq: Four times a day (QID) | ORAL | Status: DC

## 2013-09-27 MED ORDER — HYDROCODONE-ACETAMINOPHEN 5-325 MG PO TABS: 1.0000 | ORAL_TABLET | ORAL | Status: DC | PRN

## 2013-09-27 NOTE — Discharge Instructions (Signed)
Pilonidal Cyst A pilonidal cyst occurs when hairs get trapped (ingrown) beneath the skin in the crease between the buttocks over your sacrum (the bone under that crease). Pilonidal cysts are most common in young men with a lot of body hair. When the cyst is ruptured (breaks) or leaking, fluid from the cyst may cause burning and itching. If the cyst becomes infected, it causes a painful swelling filled with pus (abscess). The pus and trapped hairs need to be removed (often by lancing) so that the infection can heal. However, recurrence is common and an operation may be needed to remove the cyst. HOME CARE INSTRUCTIONS   If the cyst was NOT INFECTED:  Keep the area clean and dry. Bathe or shower daily. Wash the area well with a germ-killing soap. Warm tub baths may help prevent infection and help with drainage. Dry the area well with a towel.  Avoid tight clothing to keep area as moisture free as possible.  Keep area between buttocks as free of hair as possible. A depilatory may be used.  If the cyst WAS INFECTED and needed to be drained:  Your caregiver packed the wound with gauze to keep the wound open. This allows the wound to heal from the inside outwards and continue draining.  Return for a wound check in 1 day or as suggested.  If you take tub baths or showers, repack the wound with gauze following them. Sponge baths (at the sink) are a good alternative.  If an antibiotic was ordered to fight the infection, take as directed.  Only take over-the-counter or prescription medicines for pain, discomfort, or fever as directed by your caregiver.  After the drain is removed, use sitz baths for 20 minutes 4 times per day. Clean the wound gently with mild unscented soap, pat dry, and then apply a dry dressing. SEEK MEDICAL CARE IF:   You have increased pain, swelling, redness, drainage, or bleeding from the area.  You have a fever.  You have muscles aches, dizziness, or a general ill  feeling. Document Released: 02/16/2000 Document Revised: 05/13/2011 Document Reviewed: 04/15/2008 ExitCare Patient Information 2015 [employer]. This information is not intended to replace advice given to you by your health care provider. Make sure you discuss any questions you have with your health care provider.  

## 2013-09-27 NOTE — ED Notes (Signed)
Pt c/o of boil on buttock starting 3 days ago. Boil is open, red, hot, and swollen on left buttock near gluteal crease.

## 2013-09-27 NOTE — ED Provider Notes (Signed)
CSN: [id number]     Arrival date & time 09/27/13  1614 History  This chart was scribed for non-physician practitioner, Junious SilkHannah Daina Cara, PA-C working with Audree CamelScott T Goldston, MD, by Jarvis Morganaylor Ferguson, ED Scribe. This patient was seen in room WTR8/WTR8 and the patient's care was started at 4:45 PM.    Chief Complaint  Patient presents with  . Recurrent Skin Infections       The history is provided by the patient. No language interpreter was used.   HPI Comments: Connie Benson is a 33 y.o. female who presents to the Emergency Department complaining of of a boil on her left buttock that began 3 days ago. She states that the boil is red, hot and swollen. She states she is having an associated headache. She states that the pain from the boil is exacerbated by ambulating and sitting. She states she has taken Ibuprofen with no relief. Last dosage was last night. She denies any drainage from the site, fever, chills, or pain with bowel movements. She has had prior abscesses, but never in this location.      Past Medical History  Diagnosis Date  . Anemia    Past Surgical History  Procedure Laterality Date  . No past surgeries     Family History  Problem Relation Age of Onset  . Hypertension Mother   . Asthma Mother   . Anesthesia problems Neg Hx    History  Substance Use Topics  . Smoking status: Never Smoker   . Smokeless tobacco: Never Used  . Alcohol Use: No   OB History   Grav Para Term Preterm Abortions TAB SAB Ect Mult Living   4 3 3  1  1   3      Review of Systems  Constitutional: Negative for fever and chills.  Gastrointestinal:       Denies pain with bowel movements  Skin:       Positive for abscess to left buttock. Denies drainage from site  Neurological: Positive for headaches.  All other systems reviewed and are negative.     Allergies  Pineapple  Home Medications   Prior to Admission medications   Medication Sig Start Date End Date Taking? Authorizing  Provider  ibuprofen (ADVIL,MOTRIN) 600 MG tablet Take 1 tablet (600 mg total) by mouth every 6 (six) hours. 12/06/12   Anselm LisMelanie Marsh, MD  Prenatal Vit-Fe Fumarate-FA (PRENATAL MULTIVITAMIN) TABS Take 1 tablet by mouth daily.      Historical Provider, MD   Triage Vitals: BP 132/92  Pulse 112  Temp(Src) 98.9 F (37.2 C) (Oral)  Resp 18  SpO2 99%  Physical Exam  Nursing note and vitals reviewed. Constitutional: She is oriented to person, place, and time. She appears well-developed and well-nourished. No distress.  HENT:  Head: Normocephalic and atraumatic.  Right Ear: External ear normal.  Left Ear: External ear normal.  Nose: Nose normal.  Mouth/Throat: Oropharynx is clear and moist.  Eyes: Conjunctivae are normal.  Neck: Normal range of motion.  Cardiovascular: Normal rate, regular rhythm and normal heart sounds.   Pulmonary/Chest: Effort normal and breath sounds normal. No stridor. No respiratory distress. She has no wheezes. She has no rales.  Abdominal: Soft. She exhibits no distension.  Genitourinary: Rectal exam shows no tenderness.  No tenderness to palpation to anus or rectum  Musculoskeletal: Normal range of motion.  Neurological: She is alert and oriented to person, place, and time. She has normal strength.  Skin: Skin is warm and dry. She  is not diaphoretic. No erythema.  3 cm area of induration and erythema to left buttock. Central fluctuance. No drainage.   Psychiatric: She has a normal mood and affect. Her behavior is normal.    ED Course  Procedures (including critical care time)  DIAGNOSTIC STUDIES: Oxygen Saturation is 99% on RA, normal by my interpretation.    COORDINATION OF CARE:  4:57 PM  INCISION AND DRAINAGE  Performed by: Junious Silk, PA-C Authorized by: Junious Silk, PA-C  Consent - Verbal Consent obtained Risks and benefits: risks/benefits and alternatives were discussed  Type: Abscess  Body Area: left buttock  Anesthesia: Local  infiltration Local anesthetic: lidocaine 2% with epinephrine  Anesthetic total: 3 ml  Complexity: Complex  Blunt dissection to break up loculations  Drainage: Purulent  Drainage amount: moderate purulent  Packing material: 1/4 iodoform gauze  Patient tolerance: Patient tolerated the procedure well with no immediate complications     Labs Review Labs Reviewed - No data to display  Imaging Review No results found.   EKG Interpretation None      MDM   Final diagnoses:  Pilonidal abscess   Patient with skin abscess amenable to incision and drainage.  No concern for perirectal or perianal abscess. Wound recheck in 2 days. Packing in place. Encouraged home warm soaks and flushing.  Mild signs of cellulitis is surrounding skin.  Will d/c to home.  Patient discharged with Bactrim and Keflex. Dicussed reasons to return to ED sooner. Vital signs stable for discharge. Patient / Family / Caregiver informed of clinical course, understand medical decision-making process, and agree with plan.   I personally performed the services described in this documentation, which was scribed in my presence. The recorded information has been reviewed and is accurate.    Mora Bellman, PA-C 09/27/13 1922

## 2013-09-28 NOTE — ED Provider Notes (Signed)
Medical screening examination/treatment/procedure(s) were performed by non-physician practitioner and as supervising physician I was immediately available for consultation/collaboration.   EKG Interpretation None        Tory Mckissack T Viva Gallaher, MD 09/28/13 1530 

## 2013-09-29 ENCOUNTER — Encounter (HOSPITAL_COMMUNITY): Payer: Self-pay | Admitting: Emergency Medicine

## 2013-09-29 ENCOUNTER — Emergency Department (HOSPITAL_COMMUNITY)
Admission: EM | Admit: 2013-09-29 | Discharge: 2013-09-29 | Disposition: A | Discharge status: Home / Self Care | Payer: Commercial Managed Care - PPO | Attending: Emergency Medicine | Admitting: Emergency Medicine

## 2013-09-29 DIAGNOSIS — Z862 Personal history of diseases of the blood and blood-forming organs and certain disorders involving the immune mechanism: Secondary | ICD-10-CM | POA: Insufficient documentation

## 2013-09-29 DIAGNOSIS — Z792 Long term (current) use of antibiotics: Secondary | ICD-10-CM | POA: Insufficient documentation

## 2013-09-29 DIAGNOSIS — Z4801 Encounter for change or removal of surgical wound dressing: Secondary | ICD-10-CM | POA: Insufficient documentation

## 2013-09-29 DIAGNOSIS — Z5189 Encounter for other specified aftercare: Secondary | ICD-10-CM

## 2013-09-29 NOTE — Progress Notes (Signed)
[facility] provided pt with a list of primary care resources to help patient establish primary care.  °

## 2013-09-29 NOTE — ED Provider Notes (Signed)
CSN: [id number]     Arrival date & time 09/29/13  1345 History  This chart was scribed for non-physician practitioner, Coral Ceo, PA-C working with Hurman Horn, MD by Greggory Stallion, ED scribe. This patient was seen in room WTR6/WTR6 and the patient's care was started at 2:28 PM.   Chief Complaint  Patient presents with  . Abscess  . Wound Check   The history is provided by the patient. No language interpreter was used.   HPI Comments: Connie Benson is a 33 y.o. female who presents to the Emergency Department for wound check. Pt was evaluated 2 days ago in the ED for an abscess to her buttock. It was I&D'd and packed. She was discharged with keflex, bactrim and Vicodin. Pt has been taking antibiotics as prescribed. States she is here for a recheck and packing removal. She did not remove the packing herself and is unsure if it fell out. Pt states there is decreased pain around the area and is still noticing mild clear drainage. No purulent drainage. Rates pain 2/10 but states it is worsened with sitting down. Denies fever, chills, spreading redness/swelling.    Past Medical History  Diagnosis Date  . Anemia    Past Surgical History  Procedure Laterality Date  . No past surgeries     Family History  Problem Relation Age of Onset  . Hypertension Mother   . Asthma Mother   . Anesthesia problems Neg Hx    History  Substance Use Topics  . Smoking status: Never Smoker   . Smokeless tobacco: Never Used  . Alcohol Use: No   OB History   Grav Para Term Preterm Abortions TAB SAB Ect Mult Living   4 3 3  1  1   3      Review of Systems  Constitutional: Negative for fever, chills, activity change, appetite change and fatigue.  Gastrointestinal: Negative for nausea, vomiting and abdominal pain.  Musculoskeletal: Negative for arthralgias, back pain and myalgias.  Skin: Positive for wound. Negative for color change.  Neurological: Negative for weakness.  All other systems reviewed  and are negative.  Allergies  Pineapple  Home Medications   Prior to Admission medications   Medication Sig Start Date End Date Taking? Authorizing Provider  cephALEXin (KEFLEX) 500 MG capsule Take 500 mg by mouth 4 (four) times daily. For 10 days. 09/27/13  Yes Mora Bellman, PA-C  HYDROcodone-acetaminophen (NORCO/VICODIN) 5-325 MG per tablet Take 1 tablet by mouth every 4 (four) hours as needed for moderate pain or severe pain. 09/27/13  Yes Mora Bellman, PA-C  ondansetron (ZOFRAN) 4 MG tablet Take 1 tablet (4 mg total) by mouth every 6 (six) hours. 09/27/13  Yes Mora Bellman, PA-C  sulfamethoxazole-trimethoprim (BACTRIM DS,SEPTRA DS) 800-160 MG per tablet Take 1 tablet by mouth every 12 (twelve) hours. For 10 days. 09/27/13  Yes Ramon Dredge Merrell, PA-C   BP 126/78  Pulse 74  Temp(Src) 98.2 F (36.8 C) (Oral)  Resp 18  SpO2 100%  Filed Vitals:   09/29/13 1406  BP: 126/78  Pulse: 74  Temp: 98.2 F (36.8 C)  TempSrc: Oral  Resp: 18  SpO2: 100%    Physical Exam  Nursing note and vitals reviewed. Constitutional: She is oriented to person, place, and time. She appears well-developed and well-nourished. No distress.  Non-toxic  HENT:  Head: Normocephalic and atraumatic.  Eyes: Conjunctivae and EOM are normal.  Neck: Neck supple. No tracheal deviation present.  Cardiovascular: Normal rate.  Pulmonary/Chest: Effort normal. No respiratory distress.  Abdominal: Soft. She exhibits no distension. There is no tenderness.  Musculoskeletal: Normal range of motion.       Legs: Neurological: She is alert and oriented to person, place, and time.  Skin: Skin is warm and dry. She is not diaphoretic.  1 cm circular superficial wound to the left inferior buttocks. No drainage, foreign body or packing. Minimal serosanguinous drainage present in the wound cavity. Area non-tender without fluctuance or induration. No surrounding edema/erythema.  Psychiatric: She has a normal mood and  affect. Her behavior is normal.    ED Course  Procedures (including critical care time)  DIAGNOSTIC STUDIES: Oxygen Saturation is 100% on RA, normal by my interpretation.    COORDINATION OF CARE: 2:31 PM-Discussed treatment plan which includes warm soaks and continuing antibiotics until finished with pt at bedside and pt agreed to plan. Return precautions given.   Labs Review Labs Reviewed - No data to display  Imaging Review No results found.   EKG Interpretation None      MDM   Angelique Blonderdjara Kisner is a 33 y.o. female who presents to the Emergency Department for wound check. Pt was evaluated 2 days ago in the ED for an abscess to her buttock with packing placed. Wound probed and packing not currently present, which must have fallen out PTA. Area healing well without complications. Patient afebrile and non-toxic in appearance. Patient encouraged to continue to take the antibiotics and warm soaks. Continued wound care discussed. Return precautions, discharge instructions, and follow-up was discussed with the patient before discharge.     Discharge Medication List as of 09/29/2013  2:37 PM       Final impressions: 1. Encounter for wound re-check      Luiz IronJessica Katlin Sylvester Minton PA-C   I personally performed the services described in this documentation, which was scribed in my presence. The recorded information has been reviewed and is accurate.  Jillyn LedgerJessica K Merina Behrendt, PA-C 09/30/13 [id number]

## 2013-09-29 NOTE — Discharge Instructions (Signed)
Continue antibiotics for full dose  Continue warm soaks  Keep area clean and dry  Return to the emergency department if you develop any changing/worsening condition, increased drainage, fever, spreading redness/swelling or any other concerns (please read additional information regarding your condition below)    Wound Check Your wound appears healthy today. Your wound will heal gradually over time. Eventually a scar will form that will fade with time. FACTORS THAT AFFECT SCAR FORMATION:  People differ in the severity in which they scar.  Scar severity varies according to location, size, and the traits you inherited from your parents (genetic predisposition).  Irritation to the wound from infection, rubbing, or chemical exposure will increase the amount of scar formation. HOME CARE INSTRUCTIONS   If you were given a dressing, you should change it at least once a day or as instructed by your caregiver. If the bandage sticks, soak it off with a solution of hydrogen peroxide.  If the bandage becomes wet, dirty, or develops a bad smell, change it as soon as possible.  Look for signs of infection.  Only take over-the-counter or prescription medicines for pain, discomfort, or fever as directed by your caregiver. SEEK IMMEDIATE MEDICAL CARE IF:   You have redness, swelling, or increasing pain in the wound.  You notice pus coming from the wound.  You have a fever.  You notice a bad smell coming from the wound or dressing. Document Released: 11/25/2003 Document Revised: 05/13/2011 Document Reviewed: 02/18/2005 [facility] Patient Information 2015 [employer], [place]. This information is not intended to replace advice given to you by your health care provider. Make sure you discuss any questions you have with your health care provider.  Abscess An abscess is an infected area that contains a collection of pus and debris.It can occur in almost any part of the body. An abscess is also known as a  furuncle or boil. CAUSES  An abscess occurs when tissue gets infected. This can occur from blockage of oil or sweat glands, infection of hair follicles, or a minor injury to the skin. As the body tries to fight the infection, pus collects in the area and creates pressure under the skin. This pressure causes pain. People with weakened immune systems have difficulty fighting infections and get certain abscesses more often.  SYMPTOMS Usually an abscess develops on the skin and becomes a painful mass that is red, warm, and tender. If the abscess forms under the skin, you may feel a moveable soft area under the skin. Some abscesses break open (rupture) on their own, but most will continue to get worse without care. The infection can spread deeper into the body and eventually into the bloodstream, causing you to feel ill.  DIAGNOSIS  Your caregiver will take your medical history and perform a physical exam. A sample of fluid may also be taken from the abscess to determine what is causing your infection. TREATMENT  Your caregiver may prescribe antibiotic medicines to fight the infection. However, taking antibiotics alone usually does not cure an abscess. Your caregiver may need to make a small cut (incision) in the abscess to drain the pus. In some cases, gauze is packed into the abscess to reduce pain and to continue draining the area. HOME CARE INSTRUCTIONS   Only take over-the-counter or prescription medicines for pain, discomfort, or fever as directed by your caregiver.  If you were prescribed antibiotics, take them as directed. Finish them even if you start to feel better.  If gauze is used, follow  your caregiver's directions for changing the gauze.  To avoid spreading the infection:  Keep your draining abscess covered with a bandage.  Wash your hands well.  Do not share personal care items, towels, or whirlpools with others.  Avoid skin contact with others.  Keep your skin and clothes clean  around the abscess.  Keep all follow-up appointments as directed by your caregiver. SEEK MEDICAL CARE IF:   You have increased pain, swelling, redness, fluid drainage, or bleeding.  You have muscle aches, chills, or a general ill feeling.  You have a fever. MAKE SURE YOU:   Understand these instructions.  Will watch your condition.  Will get help right away if you are not doing well or get worse. Document Released: 11/28/2004 Document Revised: 08/20/2011 Document Reviewed: 05/03/2011 [facility] Patient Information 2015 [place], [place]. This information is not intended to replace advice given to you by your health care provider. Make sure you discuss any questions you have with your health care provider.

## 2013-09-29 NOTE — ED Notes (Signed)
Pt states that she had an abscess on her bottom lanced on Monday.  States she is here for a recheck.  States that it feels much better.

## 2013-10-01 NOTE — ED Provider Notes (Signed)
Medical screening examination/treatment/procedure(s) were performed by non-physician practitioner and as supervising physician I was immediately available for consultation/collaboration.   EKG Interpretation None       Hurman HornJohn M Khalani Novoa, MD 10/01/13 2044

## 2014-01-03 ENCOUNTER — Encounter (HOSPITAL_COMMUNITY): Payer: Self-pay | Admitting: Emergency Medicine

## 2016-09-30 ENCOUNTER — Ambulatory Visit (INDEPENDENT_AMBULATORY_CARE_PROVIDER_SITE_OTHER): Payer: Self-pay | Admitting: *Deleted

## 2016-09-30 ENCOUNTER — Encounter: Payer: Self-pay | Admitting: *Deleted

## 2016-09-30 DIAGNOSIS — Z3A01 Less than 8 weeks gestation of pregnancy: Secondary | ICD-10-CM

## 2016-09-30 DIAGNOSIS — Z3201 Encounter for pregnancy test, result positive: Secondary | ICD-10-CM

## 2016-09-30 LAB — POCT PREGNANCY, URINE: Preg Test, Ur: POSITIVE — AB

## 2016-09-30 NOTE — Progress Notes (Signed)
Patient presents to clinic for pregnancy test which is positive. Patient advised of results, meds and allergies reviewed. Pregnancy verification letter given, patient to front desk to schedule initial ob appt.

## 2016-11-13 ENCOUNTER — Other Ambulatory Visit (HOSPITAL_COMMUNITY)
Admission: RE | Admit: 2016-11-13 | Discharge: 2016-11-13 | Disposition: A | Discharge status: Home / Self Care | Payer: PRIVATE HEALTH INSURANCE | Source: Ambulatory Visit | Attending: Advanced Practice Midwife | Admitting: Advanced Practice Midwife

## 2016-11-13 ENCOUNTER — Ambulatory Visit (INDEPENDENT_AMBULATORY_CARE_PROVIDER_SITE_OTHER): Payer: Medicaid Other | Admitting: Advanced Practice Midwife

## 2016-11-13 ENCOUNTER — Encounter: Payer: Self-pay | Admitting: Advanced Practice Midwife

## 2016-11-13 VITALS — BP 125/76 | HR 85 | Wt 276.4 lb

## 2016-11-13 DIAGNOSIS — Z3480 Encounter for supervision of other normal pregnancy, unspecified trimester: Secondary | ICD-10-CM | POA: Diagnosis not present

## 2016-11-13 DIAGNOSIS — O99011 Anemia complicating pregnancy, first trimester: Secondary | ICD-10-CM | POA: Diagnosis not present

## 2016-11-13 DIAGNOSIS — O09521 Supervision of elderly multigravida, first trimester: Secondary | ICD-10-CM | POA: Diagnosis not present

## 2016-11-13 DIAGNOSIS — Z3481 Encounter for supervision of other normal pregnancy, first trimester: Secondary | ICD-10-CM

## 2016-11-13 DIAGNOSIS — Z331 Pregnant state, incidental: Secondary | ICD-10-CM

## 2016-11-13 DIAGNOSIS — Z124 Encounter for screening for malignant neoplasm of cervix: Secondary | ICD-10-CM | POA: Diagnosis not present

## 2016-11-13 DIAGNOSIS — Z113 Encounter for screening for infections with a predominantly sexual mode of transmission: Secondary | ICD-10-CM

## 2016-11-13 DIAGNOSIS — O99019 Anemia complicating pregnancy, unspecified trimester: Secondary | ICD-10-CM

## 2016-11-13 DIAGNOSIS — D571 Sickle-cell disease without crisis: Secondary | ICD-10-CM

## 2016-11-13 LAB — POCT URINALYSIS DIP (DEVICE)
BILIRUBIN URINE: NEGATIVE
GLUCOSE, UA: NEGATIVE mg/dL
Ketones, ur: NEGATIVE mg/dL
Leukocytes, UA: NEGATIVE
NITRITE: NEGATIVE
PH: 5.5 (ref 5.0–8.0)
Protein, ur: NEGATIVE mg/dL
Specific Gravity, Urine: 1.02 (ref 1.005–1.030)
Urobilinogen, UA: 0.2 mg/dL (ref 0.0–1.0)

## 2016-11-13 MED ORDER — CONCEPT OB 130-92.4-1 MG PO CAPS: 1.0000 | ORAL_CAPSULE | Freq: Every day | ORAL | 12 refills | Status: AC

## 2016-11-13 NOTE — Progress Notes (Signed)
Here for initial prenatal visit. C/o headaches at night since pregnant. Given prenatal education booklets .c/o feeling cramps in her hands at times. Declines babyscripps optimization, but would like app- signed up for app.

## 2016-11-13 NOTE — Progress Notes (Signed)
Addendum:  3:59  Medicaid home form completed

## 2016-11-13 NOTE — Patient Instructions (Signed)
First Trimester of Pregnancy The first trimester of pregnancy is from week 1 until the end of week 13 (months 1 through 3). A week after a sperm fertilizes an egg, the egg will implant on the wall of the uterus. This embryo will begin to develop into a baby. Genes from you and your partner will form the baby. The female genes will determine whether the baby will be a boy or a girl. At 6-8 weeks, the eyes and face will be formed, and the heartbeat can be seen on ultrasound. At the end of 12 weeks, all the baby's organs will be formed. Now that you are pregnant, you will want to do everything you can to have a healthy baby. Two of the most important things are to get good prenatal care and to follow your health care provider's instructions. Prenatal care is all the medical care you receive before the baby's birth. This care will help prevent, find, and treat any problems during the pregnancy and childbirth. Body changes during your first trimester Your body goes through many changes during pregnancy. The changes vary from woman to woman.  You may gain or lose a couple of pounds at first.  You may feel sick to your stomach (nauseous) and you may throw up (vomit). If the vomiting is uncontrollable, call your health care provider.  You may tire easily.  You may develop headaches that can be relieved by medicines. All medicines should be approved by your health care provider.  You may urinate more often. Painful urination may mean you have a bladder infection.  You may develop heartburn as a result of your pregnancy.  You may develop constipation because certain hormones are causing the muscles that push stool through your intestines to slow down.  You may develop hemorrhoids or swollen veins (varicose veins).  Your breasts may begin to grow larger and become tender. Your nipples may stick out more, and the tissue that surrounds them (areola) may become darker.  Your gums may bleed and may be  sensitive to brushing and flossing.  Dark spots or blotches (chloasma, mask of pregnancy) may develop on your face. This will likely fade after the baby is born.  Your menstrual periods will stop.  You may have a loss of appetite.  You may develop cravings for certain kinds of food.  You may have changes in your emotions from day to day, such as being excited to be pregnant or being concerned that something may go wrong with the pregnancy and baby.  You may have more vivid and strange dreams.  You may have changes in your hair. These can include thickening of your hair, rapid growth, and changes in texture. Some women also have hair loss during or after pregnancy, or hair that feels dry or thin. Your hair will most likely return to normal after your baby is born.  What to expect at prenatal visits During a routine prenatal visit:  You will be weighed to make sure you and the baby are growing normally.  Your blood pressure will be taken.  Your abdomen will be measured to track your baby's growth.  The fetal heartbeat will be listened to between weeks 10 and 14 of your pregnancy.  Test results from any previous visits will be discussed.  Your health care provider may ask you:  How you are feeling.  If you are feeling the baby move.  If you have had any abnormal symptoms, such as leaking fluid, bleeding, severe headaches,   or abdominal cramping.  If you are using any tobacco products, including cigarettes, chewing tobacco, and electronic cigarettes.  If you have any questions.  Other tests that may be performed during your first trimester include:  Blood tests to find your blood type and to check for the presence of any previous infections. The tests will also be used to check for low iron levels (anemia) and protein on red blood cells (Rh antibodies). Depending on your risk factors, or if you previously had diabetes during pregnancy, you may have tests to check for high blood  sugar that affects pregnant women (gestational diabetes).  Urine tests to check for infections, diabetes, or protein in the urine.  An ultrasound to confirm the proper growth and development of the baby.  Fetal screens for spinal cord problems (spina bifida) and Down syndrome.  HIV (human immunodeficiency virus) testing. Routine prenatal testing includes screening for HIV, unless you choose not to have this test.  You may need other tests to make sure you and the baby are doing well.  Follow these instructions at home: Medicines  Follow your health care provider's instructions regarding medicine use. Specific medicines may be either safe or unsafe to take during pregnancy.  Take a prenatal vitamin that contains at least 600 micrograms (mcg) of folic acid.  If you develop constipation, try taking a stool softener if your health care provider approves. Eating and drinking  Eat a balanced diet that includes fresh fruits and vegetables, whole grains, good sources of protein such as meat, eggs, or tofu, and low-fat dairy. Your health care provider will help you determine the amount of weight gain that is right for you.  Avoid raw meat and uncooked cheese. These carry germs that can cause birth defects in the baby.  Eating four or five small meals rather than three large meals a day may help relieve nausea and vomiting. If you start to feel nauseous, eating a few soda crackers can be helpful. Drinking liquids between meals, instead of during meals, also seems to help ease nausea and vomiting.  Limit foods that are high in fat and processed sugars, such as fried and sweet foods.  To prevent constipation: ? Eat foods that are high in fiber, such as fresh fruits and vegetables, whole grains, and beans. ? Drink enough fluid to keep your urine clear or pale yellow. Activity  Exercise only as directed by your health care provider. Most women can continue their usual exercise routine during  pregnancy. Try to exercise for 30 minutes at least 5 days a week. Exercising will help you: ? Control your weight. ? Stay in shape. ? Be prepared for labor and delivery.  Experiencing pain or cramping in the lower abdomen or lower back is a good sign that you should stop exercising. Check with your health care provider before continuing with normal exercises.  Try to avoid standing for long periods of time. Move your legs often if you must stand in one place for a long time.  Avoid heavy lifting.  Wear low-heeled shoes and practice good posture.  You may continue to have sex unless your health care provider tells you not to. Relieving pain and discomfort  Wear a good support bra to relieve breast tenderness.  Take warm sitz baths to soothe any pain or discomfort caused by hemorrhoids. Use hemorrhoid cream if your health care provider approves.  Rest with your legs elevated if you have leg cramps or low back pain.  If you develop   varicose veins in your legs, wear support hose. Elevate your feet for 15 minutes, 3-4 times a day. Limit salt in your diet. Prenatal care  Schedule your prenatal visits by the twelfth week of pregnancy. They are usually scheduled monthly at first, then more often in the last 2 months before delivery.  Write down your questions. Take them to your prenatal visits.  Keep all your prenatal visits as told by your health care provider. This is important. Safety  Wear your seat belt at all times when driving.  Make a list of emergency phone numbers, including numbers for family, friends, the hospital, and police and fire departments. General instructions  Ask your health care provider for a referral to a local prenatal education class. Begin classes no later than the beginning of month 6 of your pregnancy.  Ask for help if you have counseling or nutritional needs during pregnancy. Your health care provider can offer advice or refer you to specialists for help  with various needs.  Do not use hot tubs, steam rooms, or saunas.  Do not douche or use tampons or scented sanitary pads.  Do not cross your legs for long periods of time.  Avoid cat litter boxes and soil used by cats. These carry germs that can cause birth defects in the baby and possibly loss of the fetus by miscarriage or stillbirth.  Avoid all smoking, herbs, alcohol, and medicines not prescribed by your health care provider. Chemicals in these products affect the formation and growth of the baby.  Do not use any products that contain nicotine or tobacco, such as cigarettes and e-cigarettes. If you need help quitting, ask your health care provider. You may receive counseling support and other resources to help you quit.  Schedule a dentist appointment. At home, brush your teeth with a soft toothbrush and be gentle when you floss. Contact a health care provider if:  You have dizziness.  You have mild pelvic cramps, pelvic pressure, or nagging pain in the abdominal area.  You have persistent nausea, vomiting, or diarrhea.  You have a bad smelling vaginal discharge.  You have pain when you urinate.  You notice increased swelling in your face, hands, legs, or ankles.  You are exposed to fifth disease or chickenpox.  You are exposed to German measles (rubella) and have never had it. Get help right away if:  You have a fever.  You are leaking fluid from your vagina.  You have spotting or bleeding from your vagina.  You have severe abdominal cramping or pain.  You have rapid weight gain or loss.  You vomit blood or material that looks like coffee grounds.  You develop a severe headache.  You have shortness of breath.  You have any kind of trauma, such as from a fall or a car accident. Summary  The first trimester of pregnancy is from week 1 until the end of week 13 (months 1 through 3).  Your body goes through many changes during pregnancy. The changes vary from  woman to woman.  You will have routine prenatal visits. During those visits, your health care provider will examine you, discuss any test results you may have, and talk with you about how you are feeling. This information is not intended to replace advice given to you by your health care provider. Make sure you discuss any questions you have with your health care provider. Document Released: 02/12/2001 Document Revised: 01/31/2016 Document Reviewed: 01/31/2016 Elsevier Interactive Patient Education  2017 [employer].  

## 2016-11-13 NOTE — Progress Notes (Signed)
  Subjective:    Connie Benson is being seenAngelique Blonder today for her first obstetrical visit.  This is a planned pregnancy. She is at 1571w5d gestation. Her obstetrical history is significant for advanced maternal age and sickle cell trait. Spouse is negative. . Relationship with FOB: spouse, living together. Patient does intend to breast feed. Pregnancy history fully reviewed.  Patient reports no complaints.  Review of Systems:   Review of Systems  Constitutional: Negative for chills and fever.  Gastrointestinal: Negative for abdominal pain, nausea and vomiting.  Genitourinary: Negative for vaginal bleeding and vaginal discharge.    Objective:     BP 125/76   Pulse 85   Wt 276 lb 6.4 oz (125.4 kg)   LMP 08/30/2016 (Exact Date)   BMI 44.61 kg/m  Physical Exam  Nursing note and vitals reviewed. Constitutional: She is oriented to person, place, and time. She appears well-developed and well-nourished.  Cardiovascular: Normal rate, regular rhythm and normal heart sounds.   Respiratory: Effort normal and breath sounds normal.  GI: Soft. There is no tenderness.  Genitourinary: Vagina normal. No breast swelling, tenderness, discharge or bleeding. There is no lesion on the right labia. There is no lesion on the left labia. Uterus is enlarged. Cervix exhibits no motion tenderness and no friability. Right adnexum displays no mass and no tenderness. Left adnexum displays no mass and no tenderness. No bleeding in the vagina. No vaginal discharge found.  Musculoskeletal: Normal range of motion. She exhibits no edema or tenderness.  Neurological: She is alert and oriented to person, place, and time. She has normal reflexes.  Skin: Skin is warm and dry.  Psychiatric: She has a normal mood and affect.    Maternal Exam:  Introitus: Vagina is negative for discharge.    Fetal Exam Fetal Monitor Review: Mode: hand-held doppler probe.   Baseline rate: 160.         Assessment:    Pregnancy:  [id number] Patient Active Problem List   Diagnosis Date Noted  . Supervision of other normal pregnancy, antepartum 11/13/2016  . Maternal sickle cell anemia complicating pregnancy, unspecified trimester (HCC) 11/13/2016   1. Supervision of other normal pregnancy, antepartum  - Obstetric Panel, Including HIV - Culture, OB Urine - Cytology - PAP - Glucose Tolerance, 1 Hour - Enroll patient in Babyscripts Program - Prenat w/o A Vit-FeFum-FePo-FA (CONCEPT OB) 130-92.4-1 MG CAPS; Take 1 tablet by mouth daily.  Dispense: 30 capsule; Refill: 12 - US MFM Fetal Nuchal Translucency; Future  2. Maternal sickle cell anemia complicating pregnancy, unspecified trimester (HCC)   3. Elderly multigravida in first trimester  - AMB referral to maternal fetal medicine    Plan:     Initial labs drawn. Prenatal vitamins. Problem list reviewed and updated. AFP3 discussed: requested. Role of ultrasound in pregnancy discussed; fetal survey: requested. Amniocentesis discussed: not indicated. Follow up in 4 weeks. Genetic Counseling referral for AMA.   Connie KinsmanVirginia Abygale Benson 11/13/2016

## 2016-11-14 LAB — OBSTETRIC PANEL, INCLUDING HIV
ANTIBODY SCREEN: NEGATIVE
BASOS: 0 %
Basophils Absolute: 0 10*3/uL (ref 0.0–0.2)
EOS (ABSOLUTE): 0.1 10*3/uL (ref 0.0–0.4)
EOS: 2 %
HEMOGLOBIN: 11.5 g/dL (ref 11.1–15.9)
HIV Screen 4th Generation wRfx: NONREACTIVE
Hematocrit: 34.4 % (ref 34.0–46.6)
Hepatitis B Surface Ag: NEGATIVE
IMMATURE GRANS (ABS): 0 10*3/uL (ref 0.0–0.1)
Immature Granulocytes: 0 %
Lymphocytes Absolute: 1.4 10*3/uL (ref 0.7–3.1)
Lymphs: 30 %
MCH: 26.8 pg (ref 26.6–33.0)
MCHC: 33.4 g/dL (ref 31.5–35.7)
MCV: 80 fL (ref 79–97)
Monocytes Absolute: 0.5 10*3/uL (ref 0.1–0.9)
Monocytes: 10 %
NEUTROS PCT: 58 %
Neutrophils Absolute: 2.8 10*3/uL (ref 1.4–7.0)
Platelets: 313 10*3/uL (ref 150–379)
RBC: 4.29 x10E6/uL (ref 3.77–5.28)
RDW: 17.2 % — ABNORMAL HIGH (ref 12.3–15.4)
RH TYPE: POSITIVE
RPR Ser Ql: NONREACTIVE
Rubella Antibodies, IGG: 15.4 index (ref >0.99)
WBC: 4.8 10*3/uL (ref 3.4–10.8)

## 2016-11-14 LAB — GLUCOSE TOLERANCE, 1 HOUR: GLUCOSE, 1HR PP: 115 mg/dL (ref 65–199)

## 2016-11-15 DIAGNOSIS — O09529 Supervision of elderly multigravida, unspecified trimester: Secondary | ICD-10-CM | POA: Insufficient documentation

## 2016-11-15 LAB — CYTOLOGY - PAP
CHLAMYDIA, DNA PROBE: NEGATIVE
DIAGNOSIS: NEGATIVE
HPV: NOT DETECTED
Neisseria Gonorrhea: NEGATIVE

## 2016-11-15 LAB — URINE CULTURE, OB REFLEX

## 2016-11-15 LAB — CULTURE, OB URINE

## 2016-11-27 ENCOUNTER — Encounter (HOSPITAL_COMMUNITY): Payer: Self-pay

## 2016-11-27 ENCOUNTER — Ambulatory Visit (HOSPITAL_COMMUNITY): Admission: RE | Admit: 2016-11-27 | Payer: Medicaid Other | Source: Ambulatory Visit

## 2016-11-27 ENCOUNTER — Ambulatory Visit (HOSPITAL_COMMUNITY)
Admission: RE | Admit: 2016-11-27 | Discharge: 2016-11-27 | Disposition: A | Discharge status: Home / Self Care | Payer: Medicaid Other | Source: Ambulatory Visit | Attending: Advanced Practice Midwife | Admitting: Advanced Practice Midwife

## 2016-11-27 ENCOUNTER — Encounter (HOSPITAL_COMMUNITY): Payer: Medicaid Other

## 2016-11-27 ENCOUNTER — Other Ambulatory Visit: Payer: Self-pay | Admitting: Advanced Practice Midwife

## 2016-11-27 DIAGNOSIS — Z3A12 12 weeks gestation of pregnancy: Secondary | ICD-10-CM

## 2016-11-27 DIAGNOSIS — O99211 Obesity complicating pregnancy, first trimester: Secondary | ICD-10-CM | POA: Diagnosis not present

## 2016-11-27 DIAGNOSIS — O09521 Supervision of elderly multigravida, first trimester: Secondary | ICD-10-CM | POA: Insufficient documentation

## 2016-11-27 DIAGNOSIS — D573 Sickle-cell trait: Secondary | ICD-10-CM | POA: Diagnosis not present

## 2016-11-27 DIAGNOSIS — Z3480 Encounter for supervision of other normal pregnancy, unspecified trimester: Secondary | ICD-10-CM

## 2016-11-27 DIAGNOSIS — Z3682 Encounter for antenatal screening for nuchal translucency: Secondary | ICD-10-CM | POA: Diagnosis not present

## 2016-11-27 DIAGNOSIS — Z315 Encounter for genetic counseling: Secondary | ICD-10-CM | POA: Insufficient documentation

## 2016-11-27 DIAGNOSIS — O99011 Anemia complicating pregnancy, first trimester: Secondary | ICD-10-CM | POA: Diagnosis not present

## 2016-11-27 NOTE — Progress Notes (Signed)
Genetic Counseling  High-Risk Gestation Note  Appointment Date:  11/27/2016 Referred By: Dorathy Kinsman, CNM Date of Birth:  11-15-1980  Pregnancy History: [id number] Estimated Date of Delivery: 06/06/17 Estimated Gestational Age: [redacted]w[redacted]d Attending: Charlsie Merles, MD   [name] was seen for genetic counseling because of a maternal age of 36 and a history of sickle cell trait (SCT).     In summary:  Discussed AMA and associated risk for fetal aneuploidy  Discussed options for screening  First screen-NT could not be obtained due to fetal positioning  Quad screen-declined  NIPS-performed today  Ultrasound-limited ultrasound performed today; anatomy scheduled  Discussed diagnostic testing options  [facility]S-declined  Amniocentesis-declined  Reviewed family history concerns  Reviewed patient's reported history of SCT  FOB reportedly tested negative for SCT (no records available)  Discussed carrier screening options  CF and SMA-declined  Hemoglobinopathies-SCT   She was counseled regarding maternal age and the association with risk for chromosome conditions due to nondisjunction with aging of the ova.   We reviewed chromosomes, nondisjunction, and the associated 1 in 64 risk for fetal aneuploidy related to a maternal age of 36 y.o. at [redacted]w[redacted]d gestation.  She was counseled that the risk for aneuploidy decreases as gestational age increases, accounting for those pregnancies which spontaneously abort.  We specifically discussed Down syndrome (trisomy 22), trisomies 33 and 29, and sex chromosome aneuploidies (47,XXX and 47,XXY) including the common features and prognoses of each.   We reviewed available screening options including First Screen, Quad screen, noninvasive prenatal screening (NIPS)/cell free DNA (cfDNA) screening, and detailed ultrasound.  She was counseled that screening tests are used to modify a patient's a priori risk for aneuploidy, typically based on age. This  estimate provides a pregnancy specific risk assessment. We reviewed the benefits and limitations of each option. Specifically, we discussed the conditions for which each test screens, the detection rates, and false positive rates of each. She was also counseled regarding diagnostic testing via CVS and amniocentesis. We reviewed the approximate risks for complications, including spontaneous pregnancy loss. We discussed the possible results that the tests might provide including: positive, negative, unanticipated, and no result. Finally, she was counseled regarding the cost of each option and potential out of pocket expenses.  After consideration of all the options, she elected to proceed with NIPS.  Those results will be available in 5-7 days.    She also expressed interest in pursuing a nuchal translucency ultrasound, which was attempted today. Due to fetal positioning, the measurement could not be obtained.  The report will be documented separately.  The patient would like to return for a detailed ultrasound, which was scheduled today. She understands that screening tests cannot rule out all birth defects or genetic syndromes. The patient was advised of this limitation and states she still does not want additional testing at this time.    [name] was provided with written information regarding cystic fibrosis (CF), spinal muscular atrophy (SMA) and hemoglobinopathies including the carrier frequency, availability of carrier screening and prenatal diagnosis if indicated.  In addition, we discussed that CF and hemoglobinopathies are routinely screened for as part of the [place] newborn screening panel.  After further discussion, she declined screening for CF and SMA. The patient reported that she previously had screening for sickle cell anemia (SCA) and was found to have SCT. She stated that her entire family had screening at the [facility]. By report, [name] and two of  her children  were found to have SCT and her husband, the FOB, and her oldest son screened negative for SCT. It is not clear whether these individuals had hemoglobin electrophoresis screening or a sickle cell prep. Medical records were not available for review.   We discussed that SCA affects the shape and function of the red blood cell by producing abnormal hemoglobin. She was counseled that hemoglobin is a protein in the RBCs that carries oxygen to the body's organs. Individuals who have SCA have a changes within the genes that codes for hemoglobin. She was counseled that SCA is inherited in an autosomal recessive manner, and occurs when both copies of the hemoglobin gene are changed and produce an abnormal hemoglobin S. Typically, one abnormal gene for the production of hemoglobin S is inherited from each parent. A carrier of SCA has one altered copy of the gene for hemoglobin and one typical working copy. Carriers of recessive conditions typically do not have symptoms related to the condition because they still have one functioning copy of the gene, and thus some production of the typical protein coded for by that gene.  Given the recessive inheritance, we discussed the importance of understanding the FOB's carrier status in order to accurately predict the risk of a hemoglobinopathy in the fetus. We discussed that other hemoglobin variants (hemoglobin C), when combined with hemoglobin S, can cause a medically significant hemoglobinopathy. We discussed the option of repeat testing (hemoglobin electrophoresis) to determine whether the FOB has any hemoglobin variant, including SCT. However, assuming that the FOB does not have SCT or any other hemoglobin variant, the fetus would not be expected to have an increased risk for a hemoglobinopathy.  We also reviewed the availability of newborn screening in [place] for hemoglobinopathies.   Both family histories were reviewed and found to be noncontributory  for birth defects, intellectual disability, and known genetic conditions. Without further information regarding the provided family history, an accurate genetic risk cannot be calculated. Further genetic counseling is warranted if more information is obtained.  [name] denied exposure to environmental toxins or chemical agents. She denied the use of alcohol, tobacco or street drugs. She denied significant viral illnesses during the course of her pregnancy. Her medical and surgical histories were noncontributory.   I counseled [name] regarding the above risks and available options.  The approximate face-to-face time with the genetic counselor was 44 minutes. Lynnette Caffey, [employer] [facility] intern, provided >50% of the genetic counseling services today, under my direct supervision.    Donald Prose, MS Certified Genetic Counselor

## 2016-12-02 ENCOUNTER — Encounter: Payer: Self-pay | Admitting: Obstetrics & Gynecology

## 2016-12-05 ENCOUNTER — Telehealth (HOSPITAL_COMMUNITY): Payer: Self-pay

## 2016-12-05 NOTE — Telephone Encounter (Signed)
Left voice message regarding patient's NIPS (Panorama) results. Patient encouraged to return my call. Donald Prose, MS CGC

## 2016-12-09 NOTE — Telephone Encounter (Signed)
Called Sam Wunschel to discuss her prenatal cell free DNA test results.  Mrs. [name] had Panorama testing through [facility].  Testing was offered because of a maternal age of 35.  The patient was identified by name and DOB.  We reviewed that these are within normal limits, showing a less than 1 in 10,000 risk for trisomies 21, 18 and 13, and monosomy X (Turner syndrome).  In addition, the risk for triploidy and sex chromosome trisomies (47,XXX and 47,XXY) was also low risk.  We reviewed that this testing identifies > 99% of pregnancies with trisomy 31, trisomy 64, sex chromosome trisomies (47,XXX and 47,XXY), and triploidy. The detection rate for trisomy 18 is 96%.  The detection rate for monosomy X is ~92%.  The false positive rate is <0.1% for all conditions. Testing was also consistent with female fetal sex.  The patient did wish to know fetal sex.  She understands that this testing does not identify all genetic conditions.  All questions were answered to her satisfaction, she was encouraged to call with additional questions or concerns.  Despina Arias, MS Certified Genetic Counselor

## 2016-12-11 ENCOUNTER — Ambulatory Visit (INDEPENDENT_AMBULATORY_CARE_PROVIDER_SITE_OTHER): Payer: PRIVATE HEALTH INSURANCE | Admitting: Medical

## 2016-12-11 VITALS — BP 127/69 | HR 87 | Wt 276.8 lb

## 2016-12-11 DIAGNOSIS — Z23 Encounter for immunization: Secondary | ICD-10-CM

## 2016-12-11 DIAGNOSIS — O09522 Supervision of elderly multigravida, second trimester: Secondary | ICD-10-CM

## 2016-12-11 DIAGNOSIS — Z3480 Encounter for supervision of other normal pregnancy, unspecified trimester: Secondary | ICD-10-CM

## 2016-12-11 DIAGNOSIS — Z3482 Encounter for supervision of other normal pregnancy, second trimester: Secondary | ICD-10-CM

## 2016-12-11 NOTE — Patient Instructions (Signed)
Second Trimester of Pregnancy The second trimester is from week 13 through week 28, month 4 through 6. This is often the time in pregnancy that you feel your best. Often times, morning sickness has lessened or quit. You may have more energy, and you may get hungry more often. Your unborn baby (fetus) is growing rapidly. At the end of the sixth month, he or she is about 9 inches long and weighs about 1 pounds. You will likely feel the baby move (quickening) between 18 and 20 weeks of pregnancy. Follow these instructions at home:  Avoid all smoking, herbs, and alcohol. Avoid drugs not approved by your doctor.  Do not use any tobacco products, including cigarettes, chewing tobacco, and electronic cigarettes. If you need help quitting, ask your doctor. You may get counseling or other support to help you quit.  Only take medicine as told by your doctor. Some medicines are safe and some are not during pregnancy.  Exercise only as told by your doctor. Stop exercising if you start having cramps.  Eat regular, healthy meals.  Wear a good support bra if your breasts are tender.  Do not use hot tubs, steam rooms, or saunas.  Wear your seat belt when driving.  Avoid raw meat, uncooked cheese, and liter boxes and soil used by cats.  Take your prenatal vitamins.  Take 1500-2000 milligrams of calcium daily starting at the 20th week of pregnancy until you deliver your baby.  Try taking medicine that helps you poop (stool softener) as needed, and if your doctor approves. Eat more fiber by eating fresh fruit, vegetables, and whole grains. Drink enough fluids to keep your pee (urine) clear or pale yellow.  Take warm water baths (sitz baths) to soothe pain or discomfort caused by hemorrhoids. Use hemorrhoid cream if your doctor approves.  If you have puffy, bulging veins (varicose veins), wear support hose. Raise (elevate) your feet for 15 minutes, 3-4 times a day. Limit salt in your diet.  Avoid heavy  lifting, wear low heals, and sit up straight.  Rest with your legs raised if you have leg cramps or low back pain.  Visit your dentist if you have not gone during your pregnancy. Use a soft toothbrush to brush your teeth. Be gentle when you floss.  You can have sex (intercourse) unless your doctor tells you not to.  Go to your doctor visits. Get help if:  You feel dizzy.  You have mild cramps or pressure in your lower belly (abdomen).  You have a nagging pain in your belly area.  You continue to feel sick to your stomach (nauseous), throw up (vomit), or have watery poop (diarrhea).  You have bad smelling fluid coming from your vagina.  You have pain with peeing (urination). Get help right away if:  You have a fever.  You are leaking fluid from your vagina.  You have spotting or bleeding from your vagina.  You have severe belly cramping or pain.  You lose or gain weight rapidly.  You have trouble catching your breath and have chest pain.  You notice sudden or extreme puffiness (swelling) of your face, hands, ankles, feet, or legs.  You have not felt the baby move in over an hour.  You have severe headaches that do not go away with medicine.  You have vision changes. This information is not intended to replace advice given to you by your health care provider. Make sure you discuss any questions you have with your health care   provider. Document Released: 05/15/2009 Document Revised: 07/27/2015 Document Reviewed: 04/21/2012 Elsevier Interactive Patient Education  2017 Elsevier Inc.  

## 2016-12-11 NOTE — Progress Notes (Signed)
   PRENATAL VISIT NOTE  Subjective:  Connie Benson is a 36 y.o. [id number] at [redacted]w[redacted]d being seen today for ongoing prenatal care.  She is currently monitored for the following issues for this high-risk pregnancy and has Supervision of other normal pregnancy, antepartum; AMA (advanced maternal age) multigravida 35+; and Sickle cell trait (HCC) on her problem list.  Patient reports no complaints.  Contractions: Not present. Vag. Bleeding: None.  Movement: Absent. Denies leaking of fluid.   The following portions of the patient's history were reviewed and updated as appropriate: allergies, current medications, past family history, past medical history, past social history, past surgical history and problem list. Problem list updated.  Objective:   Vitals:   12/11/16 1053  BP: 127/69  Pulse: 87  Weight: 276 lb 12.8 oz (125.6 kg)    Fetal Status: Fetal Heart Rate (bpm): 154   Movement: Absent     General:  Alert, oriented and cooperative. Patient is in no acute distress.  Skin: Skin is warm and dry. No rash noted.   Cardiovascular: Normal heart rate noted  Respiratory: Normal respiratory effort, no problems with respiration noted  Abdomen: Soft, gravid, appropriate for gestational age.  Pain/Pressure: Absent     Pelvic: Cervical exam deferred        Extremities: Normal range of motion.  Edema: None  Mental Status:  Normal mood and affect. Normal behavior. Normal judgment and thought content.   Assessment and Plan:  Pregnancy: [id number] at [redacted]w[redacted]d  1. Supervision of other normal pregnancy, antepartum - Flu Vaccine QUAD 36+ mos IM - [place] MFM OB DETAIL +14 WK; scheduled   2. AMA (advanced maternal age) multigravida 35+, second trimester - [place] MFM OB DETAIL +14 WK; scheduled   Second trimester warning symptoms and general obstetric precautions including but not limited to vaginal bleeding, contractions, leaking of fluid and fetal movement were reviewed in detail with the patient. Please refer to  After Visit Summary for other counseling recommendations.  Return in about 4 weeks (around 01/08/2017) for LOB.   Vonzella Nipple, PA-C

## 2016-12-13 ENCOUNTER — Other Ambulatory Visit: Payer: Self-pay

## 2017-01-08 ENCOUNTER — Ambulatory Visit (HOSPITAL_COMMUNITY): Payer: PRIVATE HEALTH INSURANCE

## 2017-01-10 ENCOUNTER — Ambulatory Visit (INDEPENDENT_AMBULATORY_CARE_PROVIDER_SITE_OTHER): Payer: PRIVATE HEALTH INSURANCE | Admitting: Nurse Practitioner

## 2017-01-10 ENCOUNTER — Encounter: Payer: Self-pay | Admitting: Nurse Practitioner

## 2017-01-10 ENCOUNTER — Ambulatory Visit (HOSPITAL_COMMUNITY)
Admission: RE | Admit: 2017-01-10 | Discharge: 2017-01-10 | Disposition: A | Discharge status: Home / Self Care | Payer: Medicaid Other | Source: Ambulatory Visit | Attending: Medical | Admitting: Medical

## 2017-01-10 ENCOUNTER — Encounter (HOSPITAL_COMMUNITY): Payer: Self-pay

## 2017-01-10 VITALS — BP 115/58 | HR 69 | Wt 271.1 lb

## 2017-01-10 DIAGNOSIS — Z3A19 19 weeks gestation of pregnancy: Secondary | ICD-10-CM | POA: Diagnosis not present

## 2017-01-10 DIAGNOSIS — Z3689 Encounter for other specified antenatal screening: Secondary | ICD-10-CM | POA: Insufficient documentation

## 2017-01-10 DIAGNOSIS — Z3482 Encounter for supervision of other normal pregnancy, second trimester: Secondary | ICD-10-CM

## 2017-01-10 DIAGNOSIS — O99012 Anemia complicating pregnancy, second trimester: Secondary | ICD-10-CM | POA: Diagnosis not present

## 2017-01-10 DIAGNOSIS — Z3480 Encounter for supervision of other normal pregnancy, unspecified trimester: Secondary | ICD-10-CM

## 2017-01-10 DIAGNOSIS — D571 Sickle-cell disease without crisis: Secondary | ICD-10-CM

## 2017-01-10 DIAGNOSIS — O99212 Obesity complicating pregnancy, second trimester: Secondary | ICD-10-CM | POA: Insufficient documentation

## 2017-01-10 DIAGNOSIS — D573 Sickle-cell trait: Secondary | ICD-10-CM | POA: Diagnosis not present

## 2017-01-10 DIAGNOSIS — O09522 Supervision of elderly multigravida, second trimester: Secondary | ICD-10-CM | POA: Diagnosis not present

## 2017-01-10 DIAGNOSIS — Z6841 Body Mass Index (BMI) 40.0 and over, adult: Secondary | ICD-10-CM | POA: Insufficient documentation

## 2017-01-10 DIAGNOSIS — O99019 Anemia complicating pregnancy, unspecified trimester: Secondary | ICD-10-CM

## 2017-01-10 DIAGNOSIS — E669 Obesity, unspecified: Secondary | ICD-10-CM | POA: Diagnosis not present

## 2017-01-10 NOTE — Patient Instructions (Addendum)
Second Trimester of Pregnancy The second trimester is from week 13 through week 28, month 4 through 6. This is often the time in pregnancy that you feel your best. Often times, morning sickness has lessened or quit. You may have more energy, and you may get hungry more often. Your unborn baby (fetus) is growing rapidly. At the end of the sixth month, he or she is about 9 inches long and weighs about 1 pounds. You will likely feel the baby move (quickening) between 18 and 20 weeks of pregnancy. Follow these instructions at home:  Avoid all smoking, herbs, and alcohol. Avoid drugs not approved by your doctor.  Do not use any tobacco products, including cigarettes, chewing tobacco, and electronic cigarettes. If you need help quitting, ask your doctor. You may get counseling or other support to help you quit.  Only take medicine as told by your doctor. Some medicines are safe and some are not during pregnancy.  Exercise only as told by your doctor. Stop exercising if you start having cramps.  Eat regular, healthy meals.  Wear a good support bra if your breasts are tender.  Do not use hot tubs, steam rooms, or saunas.  Wear your seat belt when driving.  Avoid raw meat, uncooked cheese, and liter boxes and soil used by cats.  Take your prenatal vitamins.  Take 1500-2000 milligrams of calcium daily starting at the 20th week of pregnancy until you deliver your baby.  Try taking medicine that helps you poop (stool softener) as needed, and if your doctor approves. Eat more fiber by eating fresh fruit, vegetables, and whole grains. Drink enough fluids to keep your pee (urine) clear or pale yellow.  Take warm water baths (sitz baths) to soothe pain or discomfort caused by hemorrhoids. Use hemorrhoid cream if your doctor approves.  If you have puffy, bulging veins (varicose veins), wear support hose. Raise (elevate) your feet for 15 minutes, 3-4 times a day. Limit salt in your diet.  Avoid heavy  lifting, wear low heals, and sit up straight.  Rest with your legs raised if you have leg cramps or low back pain.  Visit your dentist if you have not gone during your pregnancy. Use a soft toothbrush to brush your teeth. Be gentle when you floss.  You can have sex (intercourse) unless your doctor tells you not to.  Go to your doctor visits. Get help if:  You feel dizzy.  You have mild cramps or pressure in your lower belly (abdomen).  You have a nagging pain in your belly area.  You continue to feel sick to your stomach (nauseous), throw up (vomit), or have watery poop (diarrhea).  You have bad smelling fluid coming from your vagina.  You have pain with peeing (urination). Get help right away if:  You have a fever.  You are leaking fluid from your vagina.  You have spotting or bleeding from your vagina.  You have severe belly cramping or pain.  You lose or gain weight rapidly.  You have trouble catching your breath and have chest pain.  You notice sudden or extreme puffiness (swelling) of your face, hands, ankles, feet, or legs.  You have not felt the baby move in over an hour.  You have severe headaches that do not go away with medicine.  You have vision changes. This information is not intended to replace advice given to you by your health care provider. Make sure you discuss any questions you have with your health care   provider. Document Released: 05/15/2009 Document Revised: 07/27/2015 Document Reviewed: 04/21/2012 Elsevier Interactive Patient Education  2017 Elsevier Inc.  

## 2017-01-10 NOTE — Progress Notes (Signed)
    Subjective:  Connie Benson is a 36 y.o. [id number] at 1663w0d being seen today for ongoing prenatal care.  She is currently monitored for the following issues for this high-risk pregnancy and has Supervision of other normal pregnancy, antepartum; AMA (advanced maternal age) multigravida 35+; and Sickle cell trait (HCC) on their problem list.  Patient reports no complaints.  Contractions: Not present. Vag. Bleeding: None.  Movement: Present. Denies leaking of fluid.   The following portions of the patient's history were reviewed and updated as appropriate: allergies, current medications, past family history, past medical history, past social history, past surgical history and problem list. Problem list updated.  Objective:   Vitals:   01/10/17 1022  BP: (!) 115/58  Pulse: 69  Weight: 271 lb 1.6 oz (123 kg)    Fetal Status: Fetal Heart Rate (bpm): 145   Movement: Present     General:  Alert, oriented and cooperative. Patient is in no acute distress.  Skin: Skin is warm and dry. No rash noted.   Cardiovascular: Normal heart rate noted  Respiratory: Normal respiratory effort, no problems with respiration noted  Abdomen: Soft, gravid, appropriate for gestational age. Fundal height 30 cm likely due to obesity. Pain/Pressure: Absent     Pelvic:  Cervical exam deferred        Extremities: Normal range of motion.  Edema: None  Mental Status: Normal mood and affect. Normal behavior. Normal judgment and thought content.   Urinalysis:      Assessment and Plan:  Pregnancy: [id number] at 363w0d  1. Supervision of other normal pregnancy, antepartum   2. AMA (advanced maternal age) multigravida 35+, second trimester   3. Maternal sickle cell anemia complicating pregnancy, unspecified trimester (HCC)   4. Supervision of elderly multigravida in second trimester  Anatomy ultrasound reviewed and EDC confirmed as 06-06-17 Preterm labor symptoms and general obstetric precautions including but not  limited to vaginal bleeding, contractions, leaking of fluid and fetal movement were reviewed in detail with the patient. Discussed postpartum contraception - info on Nexplanon given.  Does not desire any more children but does not want BTL.  Discussed irregular bleeding or possibly no bleeding with Nexplanon. Please refer to After Visit Summary for other counseling recommendations.  Return to clinic in 4 weeks.  Nolene BernheimERRI BURLESON, RN, MSN, NP-BC Nurse Practitioner, [facility], [facility] 01/10/2017 10:43 AM

## 2017-02-06 ENCOUNTER — Ambulatory Visit (INDEPENDENT_AMBULATORY_CARE_PROVIDER_SITE_OTHER): Payer: PRIVATE HEALTH INSURANCE | Admitting: Obstetrics & Gynecology

## 2017-02-06 VITALS — BP 112/71 | HR 77 | Wt 272.5 lb

## 2017-02-06 DIAGNOSIS — O99012 Anemia complicating pregnancy, second trimester: Secondary | ICD-10-CM

## 2017-02-06 DIAGNOSIS — D573 Sickle-cell trait: Secondary | ICD-10-CM

## 2017-02-06 DIAGNOSIS — O09522 Supervision of elderly multigravida, second trimester: Secondary | ICD-10-CM

## 2017-02-06 DIAGNOSIS — Z3480 Encounter for supervision of other normal pregnancy, unspecified trimester: Secondary | ICD-10-CM

## 2017-02-06 NOTE — Progress Notes (Signed)
History:  Connie Benson is a 36 y.o. [phone] who presents to clinic today for ongoing prenatal care.  She is currently monitored for the following issues for this high-risk pregnancy and has Supervision of other normal pregnancy, antepartum; AMA (advanced maternal age) multigravida 35+; and Sickle cell trait (HCC) on their problem list.  Patient reports no complaints.  Contractions: Not present. Vag. Bleeding: None.  Movement: Present. Denies leaking of fluid.   The following portions of the patient's history were reviewed and updated as appropriate: allergies, current medications, family history, past medical history, social history, past surgical history and problem list.  Objective:  Physical Exam BP 112/71   Pulse 77   Wt 272 lb 8 oz (123.6 kg)   LMP 08/30/2016 (Exact Date)   BMI 43.98 kg/m   Fetal Status: Fetal Heart Rate (bpm): 140  Measurement: 25 cm   Movement: Present          General:  Alert, oriented and cooperative. Patient is in no acute distress.   Skin: Skin is warm and dry. No rash noted.    Cardiovascular: Normal heart rate noted   Respiratory: Normal respiratory effort, no problems with respiration noted   Abdomen: Soft, gravid, appropriate for gestational age. Fundal height 30 cm likely due to obesity. Pain/Pressure: Absent      Pelvic:  Cervical exam deferred         Extremities: Normal range of motion.  Edema: None  Mental Status: Normal mood and affect. Normal behavior. Normal judgment and thought content.    Labs and Imaging none  Assessment & Plan:  Pregnancy: [id number] at 2120w6d  1. Supervision of other normal pregnancy, antepartum Patient has no complaints today, has done well in prior pregnancies.  Follow-up at 26-27 weeks for 2hr GTT and bloodwork. She has elected to have a tubal ligation after delivery.  2. AMA (advanced maternal age) multigravida 35+, second trimester  3. Maternal sickle cell anemia complicating pregnancy, unspecified  trimester   Felix Ahmadirennepohl, Delphina Schum J, Medical Student 02/06/2017 9:31 AM

## 2017-02-06 NOTE — Progress Notes (Signed)
Pt c/o occasional cramping in hands or feet

## 2017-02-06 NOTE — Patient Instructions (Signed)
Second Trimester of Pregnancy The second trimester is from week 13 through week 28, month 4 through 6. This is often the time in pregnancy that you feel your best. Often times, morning sickness has lessened or quit. You may have more energy, and you may get hungry more often. Your unborn baby (fetus) is growing rapidly. At the end of the sixth month, he or she is about 9 inches long and weighs about 1 pounds. You will likely feel the baby move (quickening) between 18 and 20 weeks of pregnancy. Follow these instructions at home:  Avoid all smoking, herbs, and alcohol. Avoid drugs not approved by your doctor.  Do not use any tobacco products, including cigarettes, chewing tobacco, and electronic cigarettes. If you need help quitting, ask your doctor. You may get counseling or other support to help you quit.  Only take medicine as told by your doctor. Some medicines are safe and some are not during pregnancy.  Exercise only as told by your doctor. Stop exercising if you start having cramps.  Eat regular, healthy meals.  Wear a good support bra if your breasts are tender.  Do not use hot tubs, steam rooms, or saunas.  Wear your seat belt when driving.  Avoid raw meat, uncooked cheese, and liter boxes and soil used by cats.  Take your prenatal vitamins.  Take 1500-2000 milligrams of calcium daily starting at the 20th week of pregnancy until you deliver your baby.  Try taking medicine that helps you poop (stool softener) as needed, and if your doctor approves. Eat more fiber by eating fresh fruit, vegetables, and whole grains. Drink enough fluids to keep your pee (urine) clear or pale yellow.  Take warm water baths (sitz baths) to soothe pain or discomfort caused by hemorrhoids. Use hemorrhoid cream if your doctor approves.  If you have puffy, bulging veins (varicose veins), wear support hose. Raise (elevate) your feet for 15 minutes, 3-4 times a day. Limit salt in your diet.  Avoid heavy  lifting, wear low heals, and sit up straight.  Rest with your legs raised if you have leg cramps or low back pain.  Visit your dentist if you have not gone during your pregnancy. Use a soft toothbrush to brush your teeth. Be gentle when you floss.  You can have sex (intercourse) unless your doctor tells you not to.  Go to your doctor visits. Get help if:  You feel dizzy.  You have mild cramps or pressure in your lower belly (abdomen).  You have a nagging pain in your belly area.  You continue to feel sick to your stomach (nauseous), throw up (vomit), or have watery poop (diarrhea).  You have bad smelling fluid coming from your vagina.  You have pain with peeing (urination). Get help right away if:  You have a fever.  You are leaking fluid from your vagina.  You have spotting or bleeding from your vagina.  You have severe belly cramping or pain.  You lose or gain weight rapidly.  You have trouble catching your breath and have chest pain.  You notice sudden or extreme puffiness (swelling) of your face, hands, ankles, feet, or legs.  You have not felt the baby move in over an hour.  You have severe headaches that do not go away with medicine.  You have vision changes. This information is not intended to replace advice given to you by your health care provider. Make sure you discuss any questions you have with your health care   provider. Document Released: 05/15/2009 Document Revised: 07/27/2015 Document Reviewed: 04/21/2012 Elsevier Interactive Patient Education  2017 Elsevier Inc.  

## 2017-03-04 NOTE — L&D Delivery Note (Addendum)
Patient is a 37 y.o. now [id number] s/p NSVD at 5114w0d, who was admitted for IOL for A2GDM.  She progressed with augmentation to complete.  Cord clamping delayed by 1 minute then clamped by me with supervision and cut by Dr. Doroteo GlassmanPhelps.  Placenta intact with trailing membranes, extracted with ring forceps, bleeding minimal.  No abrasions noted nor lacerations requiring repair.  Delivery Note At 8:50 PM a viable female was delivered via Vaginal, Spontaneous (Presentation: OA) in usual fashion.  AROM just prior to delivery. APGAR: 8, 9; weight pending.   Placenta status: intact.  Cord: 3V with the following complications: none.  Cord pH: N/A  Anesthesia: None Episiotomy: None Lacerations: None Suture Repair: None Est. Blood Loss (mL): 250  Mom to postpartum.  Baby to Couplet care / Skin to Skin.  Ellwood DenseAlison Rumball, DO 05/30/17, 9:44 PM

## 2017-03-07 ENCOUNTER — Encounter: Payer: Self-pay | Admitting: General Practice

## 2017-03-07 ENCOUNTER — Ambulatory Visit (INDEPENDENT_AMBULATORY_CARE_PROVIDER_SITE_OTHER): Payer: Medicaid Other | Admitting: Family Medicine

## 2017-03-07 VITALS — BP 132/77 | HR 82 | Wt 269.0 lb

## 2017-03-07 DIAGNOSIS — Z23 Encounter for immunization: Secondary | ICD-10-CM | POA: Diagnosis not present

## 2017-03-07 DIAGNOSIS — Z3480 Encounter for supervision of other normal pregnancy, unspecified trimester: Secondary | ICD-10-CM

## 2017-03-07 DIAGNOSIS — Z3482 Encounter for supervision of other normal pregnancy, second trimester: Secondary | ICD-10-CM

## 2017-03-07 NOTE — Progress Notes (Signed)
   PRENATAL VISIT NOTE  Subjective:  Connie Benson is a 37 y.o. [id number] at 1039w0d being seen today for ongoing prenatal care.  She is currently monitored for the following issues for this low-risk pregnancy and has Supervision of other normal pregnancy, antepartum; AMA (advanced maternal age) multigravida 35+; and Sickle cell trait (HCC) on their problem list.  Patient reports no complaints.  Contractions: Not present. Vag. Bleeding: None.  Movement: Present. Denies leaking of fluid.   The following portions of the patient's history were reviewed and updated as appropriate: allergies, current medications, past family history, past medical history, past social history, past surgical history and problem list. Problem list updated.  Objective:   Vitals:   03/07/17 0937  BP: 132/77  Pulse: 82  Weight: 269 lb (122 kg)    Fetal Status: Fetal Heart Rate (bpm): 146 Fundal Height: 29 cm Movement: Present     General:  Alert, oriented and cooperative. Patient is in no acute distress.  Skin: Skin is warm and dry. No rash noted.   Cardiovascular: Normal heart rate noted  Respiratory: Normal respiratory effort, no problems with respiration noted  Abdomen: Soft, gravid, appropriate for gestational age.  Pain/Pressure: Absent     Pelvic: Cervical exam deferred        Extremities: Normal range of motion.     Mental Status:  Normal mood and affect. Normal behavior. Normal judgment and thought content.   Assessment and Plan:  Pregnancy: G5P3013 at 7239w0d  1. Supervision of other normal pregnancy, antepartum 28 wk labs - RPR - CBC - HIV antibody (with reflex) - Tdap vaccine greater than or equal to 7yo IM - Glucose Tolerance, 2 Hours w/1 Hour  Preterm labor symptoms and general obstetric precautions including but not limited to vaginal bleeding, contractions, leaking of fluid and fetal movement were reviewed in detail with the patient. Please refer to After Visit Summary for other counseling  recommendations.  Return in 2 weeks (on 03/21/2017).   Reva Boresanya S Sears Oran, MD

## 2017-03-07 NOTE — Patient Instructions (Signed)

## 2017-03-08 LAB — GLUCOSE TOLERANCE, 2 HOURS W/ 1HR
Glucose, 1 hour: 141 mg/dL (ref 65–179)
Glucose, 2 hour: 120 mg/dL (ref 65–152)
Glucose, Fasting: 93 mg/dL — ABNORMAL HIGH (ref 65–91)

## 2017-03-08 LAB — RPR: RPR: NONREACTIVE

## 2017-03-08 LAB — CBC
HEMATOCRIT: 35.4 % (ref 34.0–46.6)
Hemoglobin: 11.8 g/dL (ref 11.1–15.9)
MCH: 28.3 pg (ref 26.6–33.0)
MCHC: 33.3 g/dL (ref 31.5–35.7)
MCV: 85 fL (ref 79–97)
Platelets: 276 10*3/uL (ref 150–379)
RBC: 4.17 x10E6/uL (ref 3.77–5.28)
RDW: 15.8 % — ABNORMAL HIGH (ref 12.3–15.4)
WBC: 5.2 10*3/uL (ref 3.4–10.8)

## 2017-03-08 LAB — HIV ANTIBODY (ROUTINE TESTING W REFLEX): HIV Screen 4th Generation wRfx: NONREACTIVE

## 2017-03-10 ENCOUNTER — Encounter: Payer: Self-pay | Admitting: *Deleted

## 2017-03-10 ENCOUNTER — Encounter: Payer: Self-pay | Admitting: Family Medicine

## 2017-03-10 DIAGNOSIS — O24419 Gestational diabetes mellitus in pregnancy, unspecified control: Secondary | ICD-10-CM | POA: Insufficient documentation

## 2017-03-13 ENCOUNTER — Other Ambulatory Visit: Payer: Medicaid Other

## 2017-03-17 ENCOUNTER — Telehealth: Payer: Self-pay | Admitting: General Practice

## 2017-03-17 NOTE — Telephone Encounter (Signed)
-----   Message from Reva Boresanya S Pratt, MD sent at 03/10/2017 11:05 AM EST ----- Has GDM--schedule with Meriam SpragueBeverly and ensure she sees MD next.

## 2017-03-17 NOTE — Telephone Encounter (Signed)
Called patient at listed number- phone rings continuously then disconnects. Called patient at emergency contact number- no answer. Left message to call us back for results.

## 2017-03-25 ENCOUNTER — Other Ambulatory Visit: Payer: Self-pay

## 2017-03-25 ENCOUNTER — Encounter: Payer: Medicaid Other | Admitting: Student

## 2017-03-25 ENCOUNTER — Ambulatory Visit: Payer: Medicaid Other | Admitting: *Deleted

## 2017-03-25 ENCOUNTER — Encounter: Payer: Medicaid Other | Attending: Family Medicine | Admitting: *Deleted

## 2017-03-25 ENCOUNTER — Encounter: Payer: Medicaid Other | Admitting: Family Medicine

## 2017-03-25 DIAGNOSIS — R7302 Impaired glucose tolerance (oral): Secondary | ICD-10-CM | POA: Diagnosis not present

## 2017-03-25 DIAGNOSIS — Z713 Dietary counseling and surveillance: Secondary | ICD-10-CM | POA: Diagnosis not present

## 2017-03-25 DIAGNOSIS — R7309 Other abnormal glucose: Secondary | ICD-10-CM

## 2017-03-25 MED ORDER — ACCU-CHEK FASTCLIX LANCETS MISC: 1.0000 | Freq: Four times a day (QID) | 12 refills | Status: DC

## 2017-03-25 MED ORDER — ACCU-CHEK GUIDE W/DEVICE KIT: 1.0000 | PACK | Freq: Once | 0 refills | Status: AC

## 2017-03-25 MED ORDER — GLUCOSE BLOOD VI STRP: ORAL_STRIP | 12 refills | Status: DC

## 2017-03-25 NOTE — Progress Notes (Signed)
  Patient was seen on 03/25/2017 for Gestational Diabetes self-management.  Patient states history of GDM with last pregnancy 4 years ago. EDD: 06/06/2017. Diet history obtained, good variety of all food groups but does drink OJ daily at breakfast. Otherwise states she drinks water all day. She does skip meals if not hungry.  The following learning objectives were met by the patient :   States the definition of Gestational Diabetes  States why dietary management is important in controlling blood glucose  Describes the effects of carbohydrates on blood glucose levels  Demonstrates ability to create a balanced meal plan  Demonstrates carbohydrate counting   States when to check blood glucose levels  Demonstrates proper blood glucose monitoring techniques  States the effect of stress and exercise on blood glucose levels  States the importance of limiting caffeine and abstaining from alcohol and smoking  Plan:  Aim for 3 Carb Choices per meal (45 grams) +/- 1 either way  Aim for 1-2 Carbs per snack Begin reading food labels for Total Carbohydrate of foods Consider  increasing your activity level by walking or other activity daily as tolerated Begin checking BG before breakfast and 2 hours after first bite of breakfast, lunch and dinner as directed by MD  Bring Log Book to every medical appointment   Take medication if directed by MD  Blood glucose monitor Rx called into pharmacy: Accu Check Guide with Fast Clix drums Patient instructed to test pre breakfast and 2 hours each meal as directed by MD  Patient instructed to monitor glucose levels: FBS: 60 - 95 mg/dl 2 hour: <120 mg/dl  Patient received the following handouts:  Nutrition Diabetes and Pregnancy  Carbohydrate Counting List  Patient will be seen for follow-up as needed.

## 2017-03-25 NOTE — Telephone Encounter (Signed)
Pt seen by diabetes educator today

## 2017-04-03 ENCOUNTER — Ambulatory Visit (INDEPENDENT_AMBULATORY_CARE_PROVIDER_SITE_OTHER): Payer: Medicaid Other | Admitting: Family Medicine

## 2017-04-03 ENCOUNTER — Encounter: Payer: Self-pay | Admitting: *Deleted

## 2017-04-03 VITALS — BP 114/64 | HR 84 | Wt 268.4 lb

## 2017-04-03 DIAGNOSIS — O09523 Supervision of elderly multigravida, third trimester: Secondary | ICD-10-CM

## 2017-04-03 DIAGNOSIS — O24410 Gestational diabetes mellitus in pregnancy, diet controlled: Secondary | ICD-10-CM

## 2017-04-03 DIAGNOSIS — Z3480 Encounter for supervision of other normal pregnancy, unspecified trimester: Secondary | ICD-10-CM

## 2017-04-03 LAB — POCT URINALYSIS DIP (DEVICE)
BILIRUBIN URINE: NEGATIVE
Glucose, UA: 100 mg/dL — AB
Hgb urine dipstick: NEGATIVE
Ketones, ur: NEGATIVE mg/dL
Leukocytes, UA: NEGATIVE
NITRITE: NEGATIVE
PH: 7 (ref 5.0–8.0)
PROTEIN: NEGATIVE mg/dL
Specific Gravity, Urine: 1.02 (ref 1.005–1.030)
Urobilinogen, UA: 1 mg/dL (ref 0.0–1.0)

## 2017-04-03 NOTE — Patient Instructions (Addendum)
Following an appropriate diet and keeping your blood sugar under control is the most important thing to do for your health and that of your unborn baby.  Please check your blood sugar 4 times daily.  Please keep accurate BS logs and bring them with you to every visit.  Please bring your meter also.  Goals for Blood sugar should be: 1. Fasting (first thing in the morning before eating) should be less than 90.   2.  2 hours after meals should be less than 120.  Please eat 3 meals and 3 snacks.  Include protein (meat, dairy-cheese, eggs, nuts) with all meals.  Be mindful that carbohydrates increase your blood sugar.  Not just sweet food (cookies, cake, donuts, fruit, juice, soda) but also bread, pasta, rice, and potatoes.  You have to limit how many carbs you are eating.  Adding exercise, as little as 30 minutes a day can decrease your blood sugar.    Breastfeeding Choosing to breastfeed is one of the best decisions you can make for yourself and your baby. A change in hormones during pregnancy causes your breasts to make breast milk in your milk-producing glands. Hormones prevent breast milk from being released before your baby is born. They also prompt milk flow after birth. Once breastfeeding has begun, thoughts of your baby, as well as his or her sucking or crying, can stimulate the release of milk from your milk-producing glands. Benefits of breastfeeding Research shows that breastfeeding offers many health benefits for infants and mothers. It also offers a cost-free and convenient way to feed your baby. For your baby  Your first milk (colostrum) helps your baby's digestive system to function better.  Special cells in your milk (antibodies) help your baby to fight off infections.  Breastfed babies are less likely to develop asthma, allergies, obesity, or type 2 diabetes. They are also at lower risk for sudden infant death syndrome (SIDS).  Nutrients in breast milk are better able to  meet your baby's needs compared to infant formula.  Breast milk improves your baby's brain development. For you  Breastfeeding helps to create a very special bond between you and your baby.  Breastfeeding is convenient. Breast milk costs nothing and is always available at the correct temperature.  Breastfeeding helps to burn calories. It helps you to lose the weight that you gained during pregnancy.  Breastfeeding makes your uterus return faster to its size before pregnancy. It also slows bleeding (lochia) after you give birth.  Breastfeeding helps to lower your risk of developing type 2 diabetes, osteoporosis, rheumatoid arthritis, cardiovascular disease, and breast, ovarian, uterine, and endometrial cancer later in life. Breastfeeding basics Starting breastfeeding  Find a comfortable place to sit or lie down, with your neck and back well-supported.  Place a pillow or a rolled-up blanket under your baby to bring him or her to the level of your breast (if you are seated). Nursing pillows are specially designed to help support your arms and your baby while you breastfeed.  Make sure that your baby's tummy (abdomen) is facing your abdomen.  Gently massage your breast. With your fingertips, massage from the outer edges of your breast inward toward the nipple. This encourages milk flow. If your milk flows slowly, you may need to continue this action during the feeding.  Support your breast with 4 fingers underneath and your thumb above your nipple (make the letter "C" with your hand). Make sure your fingers are well away from your nipple and your baby's  mouth.  Stroke your baby's lips gently with your finger or nipple.  When your baby's mouth is open wide enough, quickly bring your baby to your breast, placing your entire nipple and as much of the areola as possible into your baby's mouth. The areola is the colored area around your nipple. ? More areola should be visible above your baby's  upper lip than below the lower lip. ? Your baby's lips should be opened and extended outward (flanged) to ensure an adequate, comfortable latch. ? Your baby's tongue should be between his or her lower gum and your breast.  Make sure that your baby's mouth is correctly positioned around your nipple (latched). Your baby's lips should create a seal on your breast and be turned out (everted).  It is common for your baby to suck about 2-3 minutes in order to start the flow of breast milk. Latching Teaching your baby how to latch onto your breast properly is very important. An improper latch can cause nipple pain, decreased milk supply, and poor weight gain in your baby. Also, if your baby is not latched onto your nipple properly, he or she may swallow some air during feeding. This can make your baby fussy. Burping your baby when you switch breasts during the feeding can help to get rid of the air. However, teaching your baby to latch on properly is still the best way to prevent fussiness from swallowing air while breastfeeding. Signs that your baby has successfully latched onto your nipple  Silent tugging or silent sucking, without causing you pain. Infant's lips should be extended outward (flanged).  Swallowing heard between every 3-4 sucks once your milk has started to flow (after your let-down milk reflex occurs).  Muscle movement above and in front of his or her ears while sucking.  Signs that your baby has not successfully latched onto your nipple  Sucking sounds or smacking sounds from your baby while breastfeeding.  Nipple pain.  If you think your baby has not latched on correctly, slip your finger into the corner of your baby's mouth to break the suction and place it between your baby's gums. Attempt to start breastfeeding again. Signs of successful breastfeeding Signs from your baby  Your baby will gradually decrease the number of sucks or will completely stop sucking.  Your baby  will fall asleep.  Your baby's body will relax.  Your baby will retain a small amount of milk in his or her mouth.  Your baby will let go of your breast by himself or herself.  Signs from you  Breasts that have increased in firmness, weight, and size 1-3 hours after feeding.  Breasts that are softer immediately after breastfeeding.  Increased milk volume, as well as a change in milk consistency and color by the fifth day of breastfeeding.  Nipples that are not sore, cracked, or bleeding.  Signs that your baby is getting enough milk  Wetting at least 1-2 diapers during the first 24 hours after birth.  Wetting at least 5-6 diapers every 24 hours for the first week after birth. The urine should be clear or pale yellow by the age of 5 days.  Wetting 6-8 diapers every 24 hours as your baby continues to grow and develop.  At least 3 stools in a 24-hour period by the age of 5 days. The stool should be soft and yellow.  At least 3 stools in a 24-hour period by the age of 7 days. The stool should be seedy and  yellow.  No loss of weight greater than 10% of birth weight during the first 3 days of life.  Average weight gain of 4-7 oz (113-198 g) per week after the age of 4 days.  Consistent daily weight gain by the age of 5 days, without weight loss after the age of 2 weeks. After a feeding, your baby may spit up a small amount of milk. This is normal. Breastfeeding frequency and duration Frequent feeding will help you make more milk and can prevent sore nipples and extremely full breasts (breast engorgement). Breastfeed when you feel the need to reduce the fullness of your breasts or when your baby shows signs of hunger. This is called "breastfeeding on demand." Signs that your baby is hungry include:  Increased alertness, activity, or restlessness.  Movement of the head from side to side.  Opening of the mouth when the corner of the mouth or cheek is stroked (rooting).  Increased  sucking sounds, smacking lips, cooing, sighing, or squeaking.  Hand-to-mouth movements and sucking on fingers or hands.  Fussing or crying.  Avoid introducing a pacifier to your baby in the first 4-6 weeks after your baby is born. After this time, you may choose to use a pacifier. Research has shown that pacifier use during the first year of a baby's life decreases the risk of sudden infant death syndrome (SIDS). Allow your baby to feed on each breast as long as he or she wants. When your baby unlatches or falls asleep while feeding from the first breast, offer the second breast. Because newborns are often sleepy in the first few weeks of life, you may need to awaken your baby to get him or her to feed. Breastfeeding times will vary from baby to baby. However, the following rules can serve as a guide to help you make sure that your baby is properly fed:  Newborns (babies 534 weeks of age or younger) may breastfeed every 1-3 hours.  Newborns should not go without breastfeeding for longer than 3 hours during the day or 5 hours during the night.  You should breastfeed your baby a minimum of 8 times in a 24-hour period.  Breast milk pumping Pumping and storing breast milk allows you to make sure that your baby is exclusively fed your breast milk, even at times when you are unable to breastfeed. This is especially important if you go back to work while you are still breastfeeding, or if you are not able to be present during feedings. Your lactation consultant can help you find a method of pumping that works best for you and give you guidelines about how long it is safe to store breast milk. Caring for your breasts while you breastfeed Nipples can become dry, cracked, and sore while breastfeeding. The following recommendations can help keep your breasts moisturized and healthy:  Avoid using soap on your nipples.  Wear a supportive bra designed especially for nursing. Avoid wearing underwire-style bras  or extremely tight bras (sports bras).  Air-dry your nipples for 3-4 minutes after each feeding.  Use only cotton bra pads to absorb leaked breast milk. Leaking of breast milk between feedings is normal.  Use lanolin on your nipples after breastfeeding. Lanolin helps to maintain your skin's normal moisture barrier. Pure lanolin is not harmful (not toxic) to your baby. You may also hand express a few drops of breast milk and gently massage that milk into your nipples and allow the milk to air-dry.  In the first few weeks  after giving birth, some women experience breast engorgement. Engorgement can make your breasts feel heavy, warm, and tender to the touch. Engorgement peaks within 3-5 days after you give birth. The following recommendations can help to ease engorgement:  Completely empty your breasts while breastfeeding or pumping. You may want to start by applying warm, moist heat (in the shower or with warm, water-soaked hand towels) just before feeding or pumping. This increases circulation and helps the milk flow. If your baby does not completely empty your breasts while breastfeeding, pump any extra milk after he or she is finished.  Apply ice packs to your breasts immediately after breastfeeding or pumping, unless this is too uncomfortable for you. To do this: ? Put ice in a plastic bag. ? Place a towel between your skin and the bag. ? Leave the ice on for 20 minutes, 2-3 times a day.  Make sure that your baby is latched on and positioned properly while breastfeeding.  If engorgement persists after 48 hours of following these recommendations, contact your health care provider or a Advertising copywriter. Overall health care recommendations while breastfeeding  Eat 3 healthy meals and 3 snacks every day. Well-nourished mothers who are breastfeeding need an additional 450-500 calories a day. You can meet this requirement by increasing the amount of a balanced diet that you eat.  Drink  enough water to keep your urine pale yellow or clear.  Rest often, relax, and continue to take your prenatal vitamins to prevent fatigue, stress, and low vitamin and mineral levels in your body (nutrient deficiencies).  Do not use any products that contain nicotine or tobacco, such as cigarettes and e-cigarettes. Your baby may be harmed by chemicals from cigarettes that pass into breast milk and exposure to secondhand smoke. If you need help quitting, ask your health care provider.  Avoid alcohol.  Do not use illegal drugs or marijuana.  Talk with your health care provider before taking any medicines. These include over-the-counter and prescription medicines as well as vitamins and herbal supplements. Some medicines that may be harmful to your baby can pass through breast milk.  It is possible to become pregnant while breastfeeding. If birth control is desired, ask your health care provider about options that will be safe while breastfeeding your baby. Where to find more information: [employer]: www.llli.org Contact a health care provider if:  You feel like you want to stop breastfeeding or have become frustrated with breastfeeding.  Your nipples are cracked or bleeding.  Your breasts are red, tender, or warm.  You have: ? Painful breasts or nipples. ? A swollen area on either breast. ? A fever or chills. ? Nausea or vomiting. ? Drainage other than breast milk from your nipples.  Your breasts do not become full before feedings by the fifth day after you give birth.  You feel sad and depressed.  Your baby is: ? Too sleepy to eat well. ? Having trouble sleeping. ? More than 32 week old and wetting fewer than 6 diapers in a 24-hour period. ? Not gaining weight by 22 days of age.  Your baby has fewer than 3 stools in a 24-hour period.  Your baby's skin or the white parts of his or her eyes become yellow. Get help right away if:  Your baby is overly tired  (lethargic) and does not want to wake up and feed.  Your baby develops an unexplained fever. Summary  Breastfeeding offers many health benefits for infant and mothers.  Try to breastfeed your infant when he or she shows early signs of hunger.  Gently tickle or stroke your baby's lips with your finger or nipple to allow the baby to open his or her mouth. Bring the baby to your breast. Make sure that much of the areola is in your baby's mouth. Offer one side and burp the baby before you offer the other side.  Talk with your health care provider or lactation consultant if you have questions or you face problems as you breastfeed. This information is not intended to replace advice given to you by your health care provider. Make sure you discuss any questions you have with your health care provider. Document Released: 02/18/2005 Document Revised: 03/22/2016 Document Reviewed: 03/22/2016 Elsevier Interactive Patient Education  [employer].

## 2017-04-03 NOTE — Progress Notes (Signed)
   PRENATAL VISIT NOTE  Subjective:  Connie Benson is a 37 y.o. [id number] at 8319w6d being seen today for ongoing prenatal care.  She is currently monitored for the following issues for this high-risk pregnancy and has Supervision of other normal pregnancy, antepartum; AMA (advanced maternal age) multigravida 35+; Sickle cell trait (HCC); and Gestational diabetes on their problem list.  Patient reports no complaints.  Contractions: Not present. Vag. Bleeding: None.  Movement: Present. Denies leaking of fluid.   The following portions of the patient's history were reviewed and updated as appropriate: allergies, current medications, past family history, past medical history, past social history, past surgical history and problem list. Problem list updated.  Objective:   Vitals:   04/03/17 0916  BP: 114/64  Pulse: 84  Weight: 268 lb 6.4 oz (121.7 kg)    Fetal Status: Fetal Heart Rate (bpm): 145 Fundal Height: 32 cm Movement: Present     General:  Alert, oriented and cooperative. Patient is in no acute distress.  Skin: Skin is warm and dry. No rash noted.   Cardiovascular: Normal heart rate noted  Respiratory: Normal respiratory effort, no problems with respiration noted  Abdomen: Soft, gravid, appropriate for gestational age.  Pain/Pressure: Present     Pelvic: Cervical exam deferred        Extremities: Normal range of motion.  Edema: None  Mental Status:  Normal mood and affect. Normal behavior. Normal judgment and thought content.  FBS 90-106 (3 are out of range) 2 hr 89-119 Assessment and Plan:  Pregnancy: [id number] at 1419w6d  1. Diet controlled gestational diabetes mellitus (GDM) in third trimester Continue diet Advised protein with bedtime snack Advised adding exercise 2330min/day or 10 min post meals.  2. Elderly multigravida in third trimester Normal NIPS  3. Supervision of other normal pregnancy, antepartum Continue prenatal care.   Preterm labor symptoms and general  obstetric precautions including but not limited to vaginal bleeding, contractions, leaking of fluid and fetal movement were reviewed in detail with the patient. Please refer to After Visit Summary for other counseling recommendations.  Return in 2 weeks (on 04/17/2017).   Reva Boresanya S Crosley Stejskal, MD

## 2017-04-21 ENCOUNTER — Encounter: Payer: Medicaid Other | Admitting: Obstetrics and Gynecology

## 2017-05-01 ENCOUNTER — Encounter: Payer: Medicaid Other | Admitting: Obstetrics & Gynecology

## 2017-05-05 ENCOUNTER — Other Ambulatory Visit: Payer: Medicaid Other

## 2017-05-05 ENCOUNTER — Encounter: Payer: Self-pay | Admitting: Obstetrics and Gynecology

## 2017-05-05 ENCOUNTER — Ambulatory Visit (INDEPENDENT_AMBULATORY_CARE_PROVIDER_SITE_OTHER): Payer: Medicaid Other | Admitting: Obstetrics and Gynecology

## 2017-05-05 VITALS — BP 133/74 | HR 79 | Wt 269.8 lb

## 2017-05-05 DIAGNOSIS — O09523 Supervision of elderly multigravida, third trimester: Secondary | ICD-10-CM

## 2017-05-05 DIAGNOSIS — O24410 Gestational diabetes mellitus in pregnancy, diet controlled: Secondary | ICD-10-CM

## 2017-05-05 DIAGNOSIS — Z3480 Encounter for supervision of other normal pregnancy, unspecified trimester: Secondary | ICD-10-CM

## 2017-05-05 DIAGNOSIS — Z3009 Encounter for other general counseling and advice on contraception: Secondary | ICD-10-CM

## 2017-05-05 DIAGNOSIS — Z209 Contact with and (suspected) exposure to unspecified communicable disease: Secondary | ICD-10-CM

## 2017-05-05 DIAGNOSIS — Z3483 Encounter for supervision of other normal pregnancy, third trimester: Secondary | ICD-10-CM

## 2017-05-05 DIAGNOSIS — D573 Sickle-cell trait: Secondary | ICD-10-CM

## 2017-05-05 MED ORDER — METFORMIN HCL 500 MG PO TABS: 500.0000 mg | ORAL_TABLET | Freq: Every day | ORAL | 5 refills | Status: DC

## 2017-05-05 NOTE — Progress Notes (Addendum)
   PRENATAL VISIT NOTE  Subjective:  Angelique Blonderdjara Hellberg is a 37 y.o. [id number] at 1332w3d being seen today for ongoing prenatal care.  She is currently monitored for the following issues for this high-risk pregnancy and has Supervision of other normal pregnancy, antepartum; AMA (advanced maternal age) multigravida 35+; Sickle cell trait (HCC); Gestational diabetes; and Unwanted fertility on their problem list.  Patient reports heartburn.  Contractions: Not present. Vag. Bleeding: None.  Movement: Present. Denies leaking of fluid.   GDMA1 FG: 70-106, mostly <100, last 2 weeks 90-100. 2/3 >95 PP: 70-114  The following portions of the patient's history were reviewed and updated as appropriate: allergies, current medications, past family history, past medical history, past social history, past surgical history and problem list. Problem list updated.  Objective:   Vitals:   05/05/17 0818  BP: 133/74  Pulse: 79  Weight: 269 lb 12.8 oz (122.4 kg)    Fetal Status: Fetal Heart Rate (bpm): 145 Fundal Height: 37 cm Movement: Present     General:  Alert, oriented and cooperative. Patient is in no acute distress.  Skin: Skin is warm and dry. No rash noted.   Cardiovascular: Normal heart rate noted  Respiratory: Normal respiratory effort, no problems with respiration noted  Abdomen: Soft, gravid, appropriate for gestational age.  Pain/Pressure: Absent     Pelvic: Cervical exam deferred        Extremities: Normal range of motion.  Edema: None  Mental Status:  Normal mood and affect. Normal behavior. Normal judgment and thought content.   Assessment and Plan:  Pregnancy: [id number] at 4432w3d  1. Supervision of other normal pregnancy, antepartum  2. Elderly multigravida in third trimester  3. Sickle cell trait (HCC)  4. Diet controlled gestational diabetes mellitus (GDM) in third trimester Fasting glucose trending up, last 2 weeks all around 93-100. PP all well controlled Will start metformin 500 mg  QHS  - US MFM OB FOLLOW UP; Future  5. Unwanted fertility Signed BTL papers 03/07/17  Preterm labor symptoms and general obstetric precautions including but not limited to vaginal bleeding, contractions, leaking of fluid and fetal movement were reviewed in detail with the patient. Please refer to After Visit Summary for other counseling recommendations.  Return in about 1 week (around 05/12/2017) for OB visit (MD).   Conan BowensKelly M Alayne Estrella, MD

## 2017-05-16 ENCOUNTER — Other Ambulatory Visit (HOSPITAL_COMMUNITY)
Admission: RE | Admit: 2017-05-16 | Discharge: 2017-05-16 | Disposition: A | Discharge status: Home / Self Care | Payer: Medicaid Other | Source: Ambulatory Visit | Attending: Family Medicine | Admitting: Family Medicine

## 2017-05-16 ENCOUNTER — Ambulatory Visit (INDEPENDENT_AMBULATORY_CARE_PROVIDER_SITE_OTHER): Payer: Medicaid Other | Admitting: Family Medicine

## 2017-05-16 VITALS — BP 119/72 | HR 92 | Wt 270.0 lb

## 2017-05-16 DIAGNOSIS — O24415 Gestational diabetes mellitus in pregnancy, controlled by oral hypoglycemic drugs: Secondary | ICD-10-CM

## 2017-05-16 DIAGNOSIS — Z3480 Encounter for supervision of other normal pregnancy, unspecified trimester: Secondary | ICD-10-CM

## 2017-05-16 DIAGNOSIS — D573 Sickle-cell trait: Secondary | ICD-10-CM | POA: Diagnosis not present

## 2017-05-16 DIAGNOSIS — O09523 Supervision of elderly multigravida, third trimester: Secondary | ICD-10-CM

## 2017-05-16 DIAGNOSIS — Z3483 Encounter for supervision of other normal pregnancy, third trimester: Secondary | ICD-10-CM | POA: Diagnosis not present

## 2017-05-16 LAB — OB RESULTS CONSOLE GC/CHLAMYDIA: Gonorrhea: NEGATIVE

## 2017-05-16 LAB — OB RESULTS CONSOLE GBS: STREP GROUP B AG: POSITIVE

## 2017-05-16 NOTE — Progress Notes (Signed)
   PRENATAL VISIT NOTE  Subjective:  Connie Benson is a 37 y.o. [id number] at 25103w0d being seen today for ongoing prenatal care.  She is currently monitored for the following issues for this high-risk pregnancy and has Supervision of other normal pregnancy, antepartum; AMA (advanced maternal age) multigravida 35+; Sickle cell trait (HCC); Gestational diabetes; and Unwanted fertility on their problem list.  Patient reports no complaints.  Contractions: Not present. Vag. Bleeding: None.  Movement: Present. Denies leaking of fluid.   GDM: Patient taking metformin 500mg  qhs.  Reports no hypoglycemic episodes.  Tolerating medication well Fasting: controlled 2hr PP: controlled   The following portions of the patient's history were reviewed and updated as appropriate: allergies, current medications, past family history, past medical history, past social history, past surgical history and problem list. Problem list updated.  Objective:   Vitals:   05/16/17 1046  BP: 119/72  Pulse: 92  Weight: 270 lb (122.5 kg)    Fetal Status: Fetal Heart Rate (bpm): 145 Fundal Height: 39 cm Movement: Present  Presentation: Vertex  General:  Alert, oriented and cooperative. Patient is in no acute distress.  Skin: Skin is warm and dry. No rash noted.   Cardiovascular: Normal heart rate noted  Respiratory: Normal respiratory effort, no problems with respiration noted  Abdomen: Soft, gravid, appropriate for gestational age.  Pain/Pressure: Present     Pelvic: Cervical exam performed Dilation: Closed Effacement (%): 50 Station: -3  Extremities: Normal range of motion.  Edema: None  Mental Status:  Normal mood and affect. Normal behavior. Normal judgment and thought content.   Assessment and Plan:  Pregnancy: [id number] at 79103w0d  1. Supervision of other normal pregnancy, antepartum FHT and FH - GC/Chlamydia probe amp ([place])[facility] - Culture, beta strep (group b only)  2. Elderly multigravida in  third trimester NIPS normal  3. Sickle cell trait (HCC)  4. Gestational diabetes mellitus (GDM) in third trimester controlled on oral hypoglycemic drug CBGs controlled NST today - reactive Growth US on 3/18, will add BPP  Term labor symptoms and general obstetric precautions including but not limited to vaginal bleeding, contractions, leaking of fluid and fetal movement were reviewed in detail with the patient. Please refer to After Visit Summary for other counseling recommendations.  No Follow-up on file.   Levie HeritageJacob J Stinson, DO

## 2017-05-19 ENCOUNTER — Telehealth (HOSPITAL_COMMUNITY): Payer: Self-pay | Admitting: *Deleted

## 2017-05-19 ENCOUNTER — Ambulatory Visit (HOSPITAL_COMMUNITY)
Admission: RE | Admit: 2017-05-19 | Discharge: 2017-05-19 | Disposition: A | Discharge status: Home / Self Care | Payer: Medicaid Other | Source: Ambulatory Visit | Attending: Obstetrics and Gynecology | Admitting: Obstetrics and Gynecology

## 2017-05-19 ENCOUNTER — Other Ambulatory Visit: Payer: Self-pay | Admitting: Obstetrics and Gynecology

## 2017-05-19 ENCOUNTER — Encounter (HOSPITAL_COMMUNITY): Payer: Self-pay

## 2017-05-19 ENCOUNTER — Other Ambulatory Visit: Payer: Self-pay | Admitting: Family Medicine

## 2017-05-19 DIAGNOSIS — Z862 Personal history of diseases of the blood and blood-forming organs and certain disorders involving the immune mechanism: Secondary | ICD-10-CM | POA: Insufficient documentation

## 2017-05-19 DIAGNOSIS — O24415 Gestational diabetes mellitus in pregnancy, controlled by oral hypoglycemic drugs: Secondary | ICD-10-CM | POA: Diagnosis not present

## 2017-05-19 DIAGNOSIS — O99213 Obesity complicating pregnancy, third trimester: Secondary | ICD-10-CM

## 2017-05-19 DIAGNOSIS — D573 Sickle-cell trait: Secondary | ICD-10-CM

## 2017-05-19 DIAGNOSIS — Z3A37 37 weeks gestation of pregnancy: Secondary | ICD-10-CM

## 2017-05-19 DIAGNOSIS — Z362 Encounter for other antenatal screening follow-up: Secondary | ICD-10-CM

## 2017-05-19 DIAGNOSIS — O09523 Supervision of elderly multigravida, third trimester: Secondary | ICD-10-CM

## 2017-05-19 DIAGNOSIS — O24410 Gestational diabetes mellitus in pregnancy, diet controlled: Secondary | ICD-10-CM

## 2017-05-19 LAB — GC/CHLAMYDIA PROBE AMP (~~LOC~~) NOT AT ARMC
Chlamydia: NEGATIVE
NEISSERIA GONORRHEA: NEGATIVE

## 2017-05-19 LAB — CULTURE, BETA STREP (GROUP B ONLY): Strep Gp B Culture: POSITIVE — AB

## 2017-05-19 NOTE — Telephone Encounter (Signed)
Preadmission screen  

## 2017-05-25 ENCOUNTER — Other Ambulatory Visit: Payer: Self-pay | Admitting: Obstetrics and Gynecology

## 2017-05-26 ENCOUNTER — Ambulatory Visit (INDEPENDENT_AMBULATORY_CARE_PROVIDER_SITE_OTHER): Payer: Medicaid Other | Admitting: General Practice

## 2017-05-26 ENCOUNTER — Encounter: Payer: Self-pay | Admitting: *Deleted

## 2017-05-26 ENCOUNTER — Ambulatory Visit: Payer: Self-pay

## 2017-05-26 ENCOUNTER — Ambulatory Visit (INDEPENDENT_AMBULATORY_CARE_PROVIDER_SITE_OTHER): Payer: Medicaid Other | Admitting: Obstetrics & Gynecology

## 2017-05-26 VITALS — BP 122/71 | HR 84 | Wt 270.0 lb

## 2017-05-26 DIAGNOSIS — O24415 Gestational diabetes mellitus in pregnancy, controlled by oral hypoglycemic drugs: Secondary | ICD-10-CM

## 2017-05-26 DIAGNOSIS — Z3480 Encounter for supervision of other normal pregnancy, unspecified trimester: Secondary | ICD-10-CM

## 2017-05-26 DIAGNOSIS — O09523 Supervision of elderly multigravida, third trimester: Secondary | ICD-10-CM

## 2017-05-26 LAB — POCT URINALYSIS DIP (DEVICE)
BILIRUBIN URINE: NEGATIVE
Glucose, UA: NEGATIVE mg/dL
Ketones, ur: NEGATIVE mg/dL
LEUKOCYTES UA: NEGATIVE
Nitrite: NEGATIVE
Protein, ur: NEGATIVE mg/dL
SPECIFIC GRAVITY, URINE: 1.01 (ref 1.005–1.030)
Urobilinogen, UA: 0.2 mg/dL (ref 0.0–1.0)
pH: 7 (ref 5.0–8.0)

## 2017-05-26 NOTE — Progress Notes (Signed)
Pt informed that the ultrasound is considered a limited OB ultrasound and is not intended to be a complete ultrasound exam.  Patient also informed that the ultrasound is not being completed with the intent of assessing for fetal or placental anomalies or any pelvic abnormalities.  Explained that the purpose of today's ultrasound is to assess for  BPP, presentation and AFI.  Patient acknowledges the purpose of the exam and the limitations of the study.    

## 2017-05-26 NOTE — Progress Notes (Signed)
PRENATAL VISIT NOTE  Subjective:  Connie Benson is a 37 y.o. [id number] at 3254w3d being seen today for ongoing prenatal care.  She is currently monitored for the following issues for this high-risk pregnancy and has Supervision of other normal pregnancy, antepartum; AMA (advanced maternal age) multigravida 35+; Sickle cell trait (HCC); Gestational diabetes; and Unwanted fertility on their problem list.  Patient reports no complaints.  Contractions: Not present. Vag. Bleeding: None.  Movement: Present. Denies leaking of fluid.   The following portions of the patient's history were reviewed and updated as appropriate: allergies, current medications, past family history, past medical history, past social history, past surgical history and problem list. Problem list updated.  Objective:   Vitals:   05/26/17 1016  BP: 122/71  Pulse: 84  Weight: 270 lb (122.5 kg)    Fetal Status: Fetal Heart Rate (bpm): nst Fundal Height: 40 cm Movement: Present     General:  Alert, oriented and cooperative. Patient is in no acute distress.  Skin: Skin is warm and dry. No rash noted.   Cardiovascular: Normal heart rate noted  Respiratory: Normal respiratory effort, no problems with respiration noted  Abdomen: Soft, gravid, appropriate for gestational age.  Pain/Pressure: Present     Pelvic: Cervical exam deferred        Extremities: Normal range of motion.  Edema: None  Mental Status:  Normal mood and affect. Normal behavior. Normal judgment and thought content.   [place] Mfm Fetal Bpp Wo Non Stress  Result Date: 05/19/2017 ----------------------------------------------------------------------  OBSTETRICS REPORT                      (Signed Final 05/19/2017 09:36 am) ---------------------------------------------------------------------- Patient Info  ID #:       [id number]                          D.O.B.:  June 12, 1980 (37 yrs)  Name:       Connie Benson                  Visit Date: 05/19/2017 08:10 am  ---------------------------------------------------------------------- Performed By  Performed By:     Lenise ArenaHannah Bazemore        Ref. Address:     [address]                    [address]                                                             [address]                                                             [place]                                                             [phone]  Attending:  Erle Crocker MD     Location:         [facility]  Referred By:      Dorathy Kinsman                    CNM ---------------------------------------------------------------------- Orders   #  Description                                 Code   1  [place] MFM OB FOLLOW UP                         [phone]   2  [place] MFM FETAL BPP WO NON STRESS              76819.01  ----------------------------------------------------------------------   #  Ordered By               Order #        Accession #    Episode #   1  Leroy Libman              [id number]      [id number]     [id number]   2  JACOB STINSON            [id number]      [id number]     [id number]  ---------------------------------------------------------------------- Indications   [redacted] weeks gestation of pregnancy                Z3A.37   Encounter for other antenatal screening        Z36.2   follow-up   History of sickle cell trait                   Z86.2   Advanced maternal age multigravida 69+,        O39.523   third trimester (low risk NIPS)   Obesity complicating pregnancy, third          O99.213   trimester   Gestational diabetes in pregnancy, diet        O24.410   controlled  ---------------------------------------------------------------------- OB History  Gravidity:    5         Term:   3         SAB:   1 ---------------------------------------------------------------------- Fetal Evaluation  Num Of Fetuses:     1  Fetal Heart         141  Rate(bpm):  Cardiac Activity:   Observed  Presentation:       Cephalic  Placenta:           Posterior, above cervical os  P. Cord  Insertion:  Previously Visualized  Amniotic Fluid  AFI FV:      Subjectively within normal limits  AFI Sum(cm)     %Tile       Largest Pocket(cm)  16.32           62          4.84  RUQ(cm)       RLQ(cm)       LUQ(cm)        LLQ(cm)  4.84          2.37          4.3            4.81 ---------------------------------------------------------------------- Biophysical Evaluation  Amniotic F.V:   Within normal limits  F. Tone:        Observed  F. Movement:    Observed                   Score:          8/8  F. Breathing:   Observed ---------------------------------------------------------------------- Biometry  BPD:      88.5  mm     G. Age:  35w 6d         23  %    CI:        77.38   %    70 - 86                                                          FL/HC:      24.1   %    20.8 - 22.6  HC:      318.5  mm     G. Age:  35w 6d          5  %    HC/AC:      0.90        0.92 - 1.05  AC:      354.3  mm     G. Age:  39w 2d         96  %    FL/BPD:     86.8   %    71 - 87  FL:       76.8  mm     G. Age:  39w 2d         89  %    FL/AC:      21.7   %    20 - 24  HUM:      63.6  mm     G. Age:  36w 6d         63  %  Est. FW:    3491  gm    7 lb 11 oz      88  % ---------------------------------------------------------------------- Gestational Age  LMP:           37w 3d        Date:  08/30/16                 EDD:   06/06/17  U/S Today:     37w 4d                                        EDD:   06/05/17  Best:          37w 3d     Det. By:  LMP  (08/30/16)          EDD:   06/06/17 ---------------------------------------------------------------------- Anatomy  Cranium:               Appears normal         LVOT:                   Appears normal  Cavum:                 Appears normal         Aortic Arch:  Previously seen  Ventricles:            Appears normal         Ductal Arch:            Previously seen  Choroid Plexus:        Previously seen        Diaphragm:              Appears normal  Cerebellum:            Previously  seen        Stomach:                Appears normal, left                                                                        sided  Posterior Fossa:       Previously seen        Abdomen:                Appears normal  Nuchal Fold:           Previously seen        Abdominal Wall:         Previously seen  Face:                  Profile nl; orbits     Cord Vessels:           Previously seen                         prev seen  Lips:                  Previously seen        Kidneys:                Appear normal  Palate:                Previously seen        Bladder:                Appears normal  Thoracic:              Appears normal         Spine:                  Previously seen  Heart:                 Appears normal         Upper Extremities:      Previously seen                         (4CH, axis, and situs  RVOT:                  Appears normal         Lower Extremities:      Previously seen  Other:  Female gender. Heels and 5th digit previously seen. Nasal bone          previously visualized. ---------------------------------------------------------------------- Cervix Uterus Adnexa  Cervix  Not visualized (advanced GA >29wks) ----------------------------------------------------------------------  Impression  Indication: 38 yr old G44P3013 at [redacted]w[redacted]d with gestational  diabetes A2 for fetal growth and BPP.  Findings:  1. Single intrauterine pregnancy with normal cardiac activity.  2. Estimated fetal weight is in the 88th%.  3. Posterior placenta without evidence of previa.  4. Normal amniotic fluid index.  5. The limited anatomy survey is normal.  6. Normal biophysical profile of 8/8. ---------------------------------------------------------------------- Recommendations  1. Appropriate fetal growth.  2. Gestational diabetes:  - on metformin  - recommend continue antenatal testing  - recommend delivery at 39-40 weeks or sooner if clinically  indicated  3. Advanced maternal age:  - low risk cell free fetal DNA  ----------------------------------------------------------------------                Erle Crocker, MD Electronically Signed Final Report   05/19/2017 09:36 am ----------------------------------------------------------------------  [place] Mfm Ob Follow Up  Result Date: 05/19/2017 ----------------------------------------------------------------------  OBSTETRICS REPORT                      (Signed Final 05/19/2017 09:36 am) ---------------------------------------------------------------------- Patient Info  ID #:       [id number]                          D.O.B.:  1981-03-01 (37 yrs)  Name:       Connie Benson                  Visit Date: 05/19/2017 08:10 am ---------------------------------------------------------------------- Performed By  Performed By:     Lenise Arena        Ref. Address:     [address]                    [address]                                                             R[address]                                                             [place]                                                             [phone]  Attending:        Erle Crocker MD     Location:         [facility]  Referred By:      Dorathy Kinsman                    CNM ---------------------------------------------------------------------- Orders   #  Description                                 Code   1  [place] MFM OB FOLLOW UP  16109.60   2  [place] MFM FETAL BPP WO NON STRESS              76819.01  ----------------------------------------------------------------------   #  Ordered By               Order #        Accession #    Episode #   1  Leroy Libman              [id number]      [id number]     [id number]   2  JACOB STINSON            [id number]      [id number]     [id number]  ---------------------------------------------------------------------- Indications   [redacted] weeks gestation of pregnancy                Z3A.37   Encounter for other antenatal screening        Z36.2   follow-up   History of sickle cell  trait                   Z86.2   Advanced maternal age multigravida 40+,        O59.523   third trimester (low risk NIPS)   Obesity complicating pregnancy, third          O99.213   trimester   Gestational diabetes in pregnancy, diet        O24.410   controlled  ---------------------------------------------------------------------- OB History  Gravidity:    5         Term:   3         SAB:   1 ---------------------------------------------------------------------- Fetal Evaluation  Num Of Fetuses:     1  Fetal Heart         141  Rate(bpm):  Cardiac Activity:   Observed  Presentation:       Cephalic  Placenta:           Posterior, above cervical os  P. Cord Insertion:  Previously Visualized  Amniotic Fluid  AFI FV:      Subjectively within normal limits  AFI Sum(cm)     %Tile       Largest Pocket(cm)  16.32           62          4.84  RUQ(cm)       RLQ(cm)       LUQ(cm)        LLQ(cm)  4.84          2.37          4.3            4.81 ---------------------------------------------------------------------- Biophysical Evaluation  Amniotic F.V:   Within normal limits       F. Tone:        Observed  F. Movement:    Observed                   Score:          8/8  F. Breathing:   Observed ---------------------------------------------------------------------- Biometry  BPD:      88.5  mm     G. Age:  35w 6d         23  %    CI:        77.38   %    70 - 86  FL/HC:      24.1   %    20.8 - 22.6  HC:      318.5  mm     G. Age:  35w 6d          5  %    HC/AC:      0.90        0.92 - 1.05  AC:      354.3  mm     G. Age:  39w 2d         96  %    FL/BPD:     86.8   %    71 - 87  FL:       76.8  mm     G. Age:  39w 2d         89  %    FL/AC:      21.7   %    20 - 24  HUM:      63.6  mm     G. Age:  36w 6d         63  %  Est. FW:    3491  gm    7 lb 11 oz      88  % ---------------------------------------------------------------------- Gestational Age  LMP:           37w 3d         Date:  08/30/16                 EDD:   06/06/17  U/S Today:     37w 4d                                        EDD:   06/05/17  Best:          37w 3d     Det. By:  LMP  (08/30/16)          EDD:   06/06/17 ---------------------------------------------------------------------- Anatomy  Cranium:               Appears normal         LVOT:                   Appears normal  Cavum:                 Appears normal         Aortic Arch:            Previously seen  Ventricles:            Appears normal         Ductal Arch:            Previously seen  Choroid Plexus:        Previously seen        Diaphragm:              Appears normal  Cerebellum:            Previously seen        Stomach:                Appears normal, left  sided  Posterior Fossa:       Previously seen        Abdomen:                Appears normal  Nuchal Fold:           Previously seen        Abdominal Wall:         Previously seen  Face:                  Profile nl; orbits     Cord Vessels:           Previously seen                         prev seen  Lips:                  Previously seen        Kidneys:                Appear normal  Palate:                Previously seen        Bladder:                Appears normal  Thoracic:              Appears normal         Spine:                  Previously seen  Heart:                 Appears normal         Upper Extremities:      Previously seen                         (4CH, axis, and situs  RVOT:                  Appears normal         Lower Extremities:      Previously seen  Other:  Female gender. Heels and 5th digit previously seen. Nasal bone          previously visualized. ---------------------------------------------------------------------- Cervix Uterus Adnexa  Cervix  Not visualized (advanced GA >29wks) ---------------------------------------------------------------------- Impression  Indication: 37 yr old U9W1191 at [redacted]w[redacted]d with gestational   diabetes A2 for fetal growth and BPP.  Findings:  1. Single intrauterine pregnancy with normal cardiac activity.  2. Estimated fetal weight is in the 88th%.  3. Posterior placenta without evidence of previa.  4. Normal amniotic fluid index.  5. The limited anatomy survey is normal.  6. Normal biophysical profile of 8/8. ---------------------------------------------------------------------- Recommendations  1. Appropriate fetal growth.  2. Gestational diabetes:  - on metformin  - recommend continue antenatal testing  - recommend delivery at 39-40 weeks or sooner if clinically  indicated  3. Advanced maternal age:  - low risk cell free fetal DNA ----------------------------------------------------------------------                Erle Crocker, MD Electronically Signed Final Report   05/19/2017 09:36 am ----------------------------------------------------------------------   Assessment and Plan:  Pregnancy: Y7W2956 at [redacted]w[redacted]d  1. Gestational diabetes mellitus (GDM) in third trimester controlled on oral hypoglycemic drug Stable blood sugars on Metformin.  NST performed today was reviewed and was found to be reactive. Subsequent  BPP performed today was also reviewed and was found to be 10/10. AFI was also normal. IOL already scheduled at 39 weeks.  2. Elderly multigravida in third trimester 3. Supervision of other normal pregnancy, antepartum Term labor symptoms and general obstetric precautions including but not limited to vaginal bleeding, contractions, leaking of fluid and fetal movement were reviewed in detail with the patient. Please refer to After Visit Summary for other counseling recommendations.  Return for 2 hr GTT postpartum, postpartum visit.   Jaynie Collins, MD

## 2017-05-30 ENCOUNTER — Inpatient Hospital Stay (HOSPITAL_COMMUNITY)
Admission: RE | Admit: 2017-05-30 | Discharge: 2017-06-01 | DRG: 807 | Disposition: A | Discharge status: Home / Self Care | Payer: Medicaid Other | Source: Ambulatory Visit | Attending: Obstetrics and Gynecology | Admitting: Obstetrics and Gynecology

## 2017-05-30 ENCOUNTER — Encounter (HOSPITAL_COMMUNITY): Payer: Self-pay

## 2017-05-30 DIAGNOSIS — O09529 Supervision of elderly multigravida, unspecified trimester: Secondary | ICD-10-CM

## 2017-05-30 DIAGNOSIS — Z3A39 39 weeks gestation of pregnancy: Secondary | ICD-10-CM

## 2017-05-30 DIAGNOSIS — O99824 Streptococcus B carrier state complicating childbirth: Secondary | ICD-10-CM | POA: Diagnosis present

## 2017-05-30 DIAGNOSIS — O9902 Anemia complicating childbirth: Secondary | ICD-10-CM | POA: Diagnosis present

## 2017-05-30 DIAGNOSIS — O24419 Gestational diabetes mellitus in pregnancy, unspecified control: Secondary | ICD-10-CM

## 2017-05-30 DIAGNOSIS — O24425 Gestational diabetes mellitus in childbirth, controlled by oral hypoglycemic drugs: Secondary | ICD-10-CM | POA: Diagnosis present

## 2017-05-30 DIAGNOSIS — D573 Sickle-cell trait: Secondary | ICD-10-CM | POA: Diagnosis present

## 2017-05-30 LAB — CBC
HCT: 36.8 % (ref 36.0–46.0)
Hemoglobin: 12.8 g/dL (ref 12.0–15.0)
MCH: 29.3 pg (ref 26.0–34.0)
MCHC: 34.8 g/dL (ref 30.0–36.0)
MCV: 84.2 fL (ref 78.0–100.0)
PLATELETS: 219 10*3/uL (ref 150–400)
RBC: 4.37 MIL/uL (ref 3.87–5.11)
RDW: 15.6 % — ABNORMAL HIGH (ref 11.5–15.5)
WBC: 5.2 10*3/uL (ref 4.0–10.5)

## 2017-05-30 LAB — TYPE AND SCREEN
ABO/RH(D): O POS
Antibody Screen: NEGATIVE

## 2017-05-30 LAB — GLUCOSE, CAPILLARY
GLUCOSE-CAPILLARY: 96 mg/dL (ref 65–99)
Glucose-Capillary: 91 mg/dL (ref 65–99)
Glucose-Capillary: 88 mg/dL (ref 65–99)

## 2017-05-30 MED ORDER — LACTATED RINGERS IV SOLN
INTRAVENOUS | Status: DC
  Administered 2017-05-30: 07:00:00 via INTRAVENOUS

## 2017-05-30 MED ORDER — OXYCODONE-ACETAMINOPHEN 5-325 MG PO TABS: 1.0000 | ORAL_TABLET | ORAL | Status: DC | PRN

## 2017-05-30 MED ORDER — OXYTOCIN 40 UNITS IN LACTATED RINGERS INFUSION - SIMPLE MED: 2.5000 [IU]/h | INTRAVENOUS | Status: DC

## 2017-05-30 MED ORDER — SENNOSIDES-DOCUSATE SODIUM 8.6-50 MG PO TABS
2.0000 | ORAL_TABLET | ORAL | Status: DC
  Administered 2017-05-30 – 2017-06-01 (×2): 2 via ORAL
  Filled 2017-05-30 (×2): qty 2

## 2017-05-30 MED ORDER — PRENATAL MULTIVITAMIN CH
1.0000 | ORAL_TABLET | Freq: Every day | ORAL | Status: DC
  Administered 2017-05-31 – 2017-06-01 (×2): 1 via ORAL
  Filled 2017-05-30 (×2): qty 1

## 2017-05-30 MED ORDER — BENZOCAINE-MENTHOL 20-0.5 % EX AERO
1.0000 "application " | INHALATION_SPRAY | CUTANEOUS | Status: DC | PRN
  Administered 2017-05-31: 1 via TOPICAL
  Filled 2017-05-30: qty 56

## 2017-05-30 MED ORDER — DIPHENHYDRAMINE HCL 25 MG PO CAPS: 25.0000 mg | ORAL_CAPSULE | Freq: Four times a day (QID) | ORAL | Status: DC | PRN

## 2017-05-30 MED ORDER — ONDANSETRON HCL 4 MG/2ML IJ SOLN: 4.0000 mg | INTRAMUSCULAR | Status: DC | PRN

## 2017-05-30 MED ORDER — TERBUTALINE SULFATE 1 MG/ML IJ SOLN
0.2500 mg | Freq: Once | INTRAMUSCULAR | Status: DC | PRN
Start: 2017-05-30 — End: 2017-05-30
  Filled 2017-05-30: qty 1

## 2017-05-30 MED ORDER — ONDANSETRON HCL 4 MG/2ML IJ SOLN: 4.0000 mg | Freq: Four times a day (QID) | INTRAMUSCULAR | Status: DC | PRN

## 2017-05-30 MED ORDER — DIPHENHYDRAMINE HCL 50 MG/ML IJ SOLN: 12.5000 mg | INTRAMUSCULAR | Status: DC | PRN

## 2017-05-30 MED ORDER — WITCH HAZEL-GLYCERIN EX PADS: 1.0000 "application " | MEDICATED_PAD | CUTANEOUS | Status: DC | PRN

## 2017-05-30 MED ORDER — COCONUT OIL OIL: 1.0000 "application " | TOPICAL_OIL | Status: DC | PRN

## 2017-05-30 MED ORDER — SODIUM CHLORIDE 0.9 % IV SOLN
5.0000 10*6.[IU] | Freq: Once | INTRAVENOUS | Status: AC
  Administered 2017-05-30: 5 10*6.[IU] via INTRAVENOUS
  Filled 2017-05-30: qty 5

## 2017-05-30 MED ORDER — MISOPROSTOL 25 MCG QUARTER TABLET
25.0000 ug | ORAL_TABLET | ORAL | Status: DC | PRN
  Administered 2017-05-30 (×2): 25 ug via VAGINAL
  Filled 2017-05-30 (×3): qty 1

## 2017-05-30 MED ORDER — EPHEDRINE 5 MG/ML INJ
10.0000 mg | INTRAVENOUS | Status: DC | PRN
  Filled 2017-05-30: qty 2

## 2017-05-30 MED ORDER — OXYTOCIN BOLUS FROM INFUSION
500.0000 mL | Freq: Once | INTRAVENOUS | Status: AC
  Administered 2017-05-30: 500 mL via INTRAVENOUS

## 2017-05-30 MED ORDER — ACETAMINOPHEN 325 MG PO TABS: 650.0000 mg | ORAL_TABLET | ORAL | Status: DC | PRN

## 2017-05-30 MED ORDER — OXYTOCIN 40 UNITS IN LACTATED RINGERS INFUSION - SIMPLE MED
1.0000 m[IU]/min | INTRAVENOUS | Status: DC
  Administered 2017-05-30: 1 m[IU]/min via INTRAVENOUS
  Filled 2017-05-30: qty 1000

## 2017-05-30 MED ORDER — LACTATED RINGERS IV SOLN: 500.0000 mL | Freq: Once | INTRAVENOUS | Status: DC

## 2017-05-30 MED ORDER — LIDOCAINE HCL (PF) 1 % IJ SOLN
30.0000 mL | INTRAMUSCULAR | Status: DC | PRN
  Filled 2017-05-30: qty 30

## 2017-05-30 MED ORDER — PENICILLIN G POT IN DEXTROSE 60000 UNIT/ML IV SOLN
3.0000 10*6.[IU] | INTRAVENOUS | Status: DC
  Administered 2017-05-30 (×3): 3 10*6.[IU] via INTRAVENOUS
  Filled 2017-05-30 (×4): qty 50

## 2017-05-30 MED ORDER — DIBUCAINE 1 % RE OINT: 1.0000 "application " | TOPICAL_OINTMENT | RECTAL | Status: DC | PRN

## 2017-05-30 MED ORDER — OXYCODONE-ACETAMINOPHEN 5-325 MG PO TABS
2.0000 | ORAL_TABLET | ORAL | Status: DC | PRN
  Administered 2017-05-30: 2 via ORAL
  Filled 2017-05-30: qty 2

## 2017-05-30 MED ORDER — TETANUS-DIPHTH-ACELL PERTUSSIS 5-2.5-18.5 LF-MCG/0.5 IM SUSP: 0.5000 mL | Freq: Once | INTRAMUSCULAR | Status: DC

## 2017-05-30 MED ORDER — ONDANSETRON HCL 4 MG PO TABS: 4.0000 mg | ORAL_TABLET | ORAL | Status: DC | PRN

## 2017-05-30 MED ORDER — FENTANYL 2.5 MCG/ML BUPIVACAINE 1/10 % EPIDURAL INFUSION (WH - ANES)
14.0000 mL/h | INTRAMUSCULAR | Status: DC | PRN
  Filled 2017-05-30: qty 100

## 2017-05-30 MED ORDER — PHENYLEPHRINE 40 MCG/ML (10ML) SYRINGE FOR IV PUSH (FOR BLOOD PRESSURE SUPPORT)
80.0000 ug | PREFILLED_SYRINGE | INTRAVENOUS | Status: DC | PRN
  Filled 2017-05-30: qty 5
  Filled 2017-05-30: qty 10

## 2017-05-30 MED ORDER — ZOLPIDEM TARTRATE 5 MG PO TABS: 5.0000 mg | ORAL_TABLET | Freq: Every evening | ORAL | Status: DC | PRN

## 2017-05-30 MED ORDER — TERBUTALINE SULFATE 1 MG/ML IJ SOLN
0.2500 mg | Freq: Once | INTRAMUSCULAR | Status: DC | PRN
  Filled 2017-05-30: qty 1

## 2017-05-30 MED ORDER — SOD CITRATE-CITRIC ACID 500-334 MG/5ML PO SOLN: 30.0000 mL | ORAL | Status: DC | PRN

## 2017-05-30 MED ORDER — LACTATED RINGERS IV SOLN
500.0000 mL | INTRAVENOUS | Status: DC | PRN
  Administered 2017-05-30: 500 mL via INTRAVENOUS

## 2017-05-30 MED ORDER — PHENYLEPHRINE 40 MCG/ML (10ML) SYRINGE FOR IV PUSH (FOR BLOOD PRESSURE SUPPORT)
80.0000 ug | PREFILLED_SYRINGE | INTRAVENOUS | Status: DC | PRN
  Filled 2017-05-30: qty 5

## 2017-05-30 MED ORDER — SIMETHICONE 80 MG PO CHEW: 80.0000 mg | CHEWABLE_TABLET | ORAL | Status: DC | PRN

## 2017-05-30 MED ORDER — IBUPROFEN 600 MG PO TABS
600.0000 mg | ORAL_TABLET | Freq: Four times a day (QID) | ORAL | Status: DC
  Administered 2017-05-30 – 2017-06-01 (×7): 600 mg via ORAL
  Filled 2017-05-30 (×7): qty 1

## 2017-05-30 NOTE — Anesthesia Pain Management Evaluation Note (Signed)
  [facility] Pain Management Visit Note  Patient: Connie Benson, 37 y.o., female  "Hello I am a [facility] of the anesthesia team at [facility]. We have an anesthesia team available at all times to provide care throughout the hospital, including epidural management and anesthesia for C-section. I don't know your plan for the delivery whether it a natural birth, water birth, IV sedation, nitrous supplementation, doula or epidural, but we want to meet your pain goals."   1.Was your pain managed to your expectations on prior hospitalizations?   No prior hospitalizations  2.What is your expectation for pain management during this hospitalization?     Labor support without medications, Epidural and IV pain meds  3.How can we help you reach that goal? Patient wants to try natural and is open to all pain control options  Record the patient's initial score and the patient's pain goal.   Pain: 0  Pain Goal: 10 [facility] wants you to be able to say your pain was always managed very well.  Rica RecordsICKELTON,Trenita Hulme 05/30/2017

## 2017-05-30 NOTE — H&P (Signed)
Connie Benson is a 37 y.o. female [phone] with IUP at 218w0d presenting for IOL for A2DM. [facility]  Prenatal History/Complications:  A2DM EFW 88%  Past Medical History: Past Medical History:  Diagnosis Date  . Anemia     Past Surgical History: Past Surgical History:  Procedure Laterality Date  . NO PAST SURGERIES      Obstetrical History: OB History    Gravida  5   Para  3   Term  3   Preterm      AB  1   Living  3     SAB  1   TAB      Ectopic      Multiple      Live Births  3           Social History: Social History   Socioeconomic History  . Marital status: Married    Spouse name: Not on file  . Number of children: Not on file  . Years of education: Not on file  . Highest education level: Not on file  Occupational History  . Not on file  Social Needs  . Financial resource strain: Not on file  . Food insecurity:    Worry: Not on file    Inability: Not on file  . Transportation needs:    Medical: Not on file    Non-medical: Not on file  Tobacco Use  . Smoking status: Never Smoker  . Smokeless tobacco: Never Used  Substance and Sexual Activity  . Alcohol use: No  . Drug use: No  . Sexual activity: Yes    Birth control/protection: None  Lifestyle  . Physical activity:    Days per week: Not on file    Minutes per session: Not on file  . Stress: Not on file  Relationships  . Social connections:    Talks on phone: Not on file    Gets together: Not on file    Attends religious service: Not on file    Active member of club or organization: Not on file    Attends meetings of clubs or organizations: Not on file    Relationship status: Not on file  Other Topics Concern  . Not on file  Social History Narrative  . Not on file    Family History: Family History  Problem Relation Age of Onset  . Hypertension Mother   . Asthma Mother   . Diabetes Mother   . Anesthesia problems Neg Hx     Allergies: Allergies  Allergen  Reactions  . Pineapple Swelling    Swelling in mouth    Medications Prior to Admission  Medication Sig Dispense Refill Last Dose  . ACCU-CHEK FASTCLIX LANCETS MISC 1 Device by Percutaneous route 4 (four) times daily. DX:O24.419. Check BS QID. 100 each 12 Taking  . glucose blood test strip DX:O24.419. Check BS QID. 100 each 12 Taking  . metFORMIN (GLUCOPHAGE) 500 MG tablet Take 1 tablet (500 mg total) by mouth at bedtime. 30 tablet 5 Taking  . Prenat w/o A Vit-FeFum-FePo-FA (CONCEPT OB) 130-92.4-1 MG CAPS Take 1 tablet by mouth daily. 30 capsule 12 Taking        Review of Systems   Constitutional: Negative for fever and chills Eyes: Negative for visual disturbances Respiratory: Negative for shortness of breath, dyspnea Cardiovascular: Negative for chest pain or palpitations  Gastrointestinal: Negative for abdominal pain, vomiting, diarrhea and constipation.   Genitourinary: Negative for dysuria and urgency Musculoskeletal: Negative for back  pain, joint pain, myalgias  Neurological: Negative for dizziness and headaches      Last menstrual period 08/30/2016, unknown if currently breastfeeding. General appearance: alert, cooperative and no distress Lungs: clear to auscultation bilaterally Heart: regular rate and rhythm Abdomen: soft, non-tender; bowel sounds normal Extremities: Homans sign is negative, no sign of DVT DTR's 2+ Presentation: cephalic Fetal monitoring  Baseline: 140 bpm, Variability: Good {> 6 bpm), Accelerations: Reactive and Decelerations: Absent Uterine activity  None      Prenatal labs: ABO, Rh: O/Positive/-- (09/12 1128) Antibody: Negative (09/12 1128) Rubella: immune RPR: Non Reactive (01/04 1212)  HBsAg: Negative (09/12 1128)  HIV: Non Reactive (01/04 1212)    Prenatal Transfer Tool  Maternal Diabetes: Yes:  Diabetes Type:  Insulin/Medication controlled Genetic Screening: Normal Maternal Ultrasounds/Referrals: Normal Fetal Ultrasounds or  other Referrals:  None Maternal Substance Abuse:  No Significant Maternal Medications: Glyburide Significant Maternal Lab Results: Lab values include: Group B Strep positive   Clinic  [facility] Prenatal Labs  Dating  LMP Blood type: O/Positive/-- (09/12 1128)   Genetic Screen 1 Screen: Unable to obtain NT     NIPS: Normal Antibody:Negative (09/12 1128)  Anatomic [place]  Normal - female Rubella: 15.40 (09/12 1128)  GTT Early: 115            Third trimester: 93/141/120 RPR: Non Reactive (09/12 1128)   Flu vaccine  12/11/16 HBsAg: Negative (09/12 1128)   TDaP vaccine  03/07/2016                                    Rhogam: NA HIV:   NR  Baby Food Breast                                           GBS: positive  Contraception  BTL-papers signed 03/07/2017 Pap: Nml 2018  Circumcision    Pediatrician  [facility]:  Support Person  Husband, Ibrahima SMA  Prenatal Classes  Hgb electrophoresis: [place] trait. Spouse neg    No results found for this or any previous visit (from the past 24 hour(s)).  Assessment: Connie Benson is a 37 y.o. [id number] with an IUP at [redacted]w[redacted]d presenting for IOL for A2DM.  Plan: #Labor: Cytotec->Foley->pitocin #Pain:  Per request #FWB Cat 1   Jacklyn Shell 05/30/2017, 7:08 AM

## 2017-05-30 NOTE — Progress Notes (Signed)
Connie Benson is a 37 y.o. [id number] at 177w0d admitted for induction of labor due to Gestational diabetes.   Subjective: Feeling intermittent mild pressure.   Objective: BP 121/69   Pulse 87   Temp 98.7 F (37.1 C) (Oral)   Resp 18   Ht 5\' 6"  (1.676 m)   Wt 122.2 kg (269 lb 6.4 oz)   LMP 08/30/2016 (Exact Date)   BMI 43.48 kg/m  No intake/output data recorded. No intake/output data recorded.  FHT:  FHR: 140 bpm, variability: moderate,  accelerations:  Present,  decelerations:  Absent UC:   irregular, every 7 minutes SVE:   Dilation: 4 Effacement (%): 50 Station: -3 Exam by:: Valentina Lucks. Woods, RN  Labs: Lab Results  Component Value Date   WBC 5.2 05/30/2017   HGB 12.8 05/30/2017   HCT 36.8 05/30/2017   MCV 84.2 05/30/2017   PLT 219 05/30/2017    Assessment / Plan: Induction of labor due to gestational diabetes,  progressing well. S/p cytotec x2. Continue to titrate pitocin.   Gestational diabetes: A2GDM on metformin. BGs well controlled. Continue q4 poct glucose, transition to q2 when in active labor. Labor: Progressing normally Fetal Wellbeing:  Category I Pain Control:  Labor support without medications Anticipated MOD:  NSVD    Connie Benson A Randolf Sansoucie 05/30/2017, 5:40 PM

## 2017-05-30 NOTE — Progress Notes (Signed)
Connie Benson is a 37 y.o. [id number] at 1928w0d admitted for induction of labor due to Gestational diabetes.  Subjective: Patient doing well. Eager to deliver. Agreeable to FB if indicated.   Objective: BP 134/75   Pulse 87   Temp 98.4 F (36.9 C) (Oral)   Resp 18   Ht 5\' 6"  (1.676 m)   Wt 122.2 kg (269 lb 6.4 oz)   LMP 08/30/2016 (Exact Date)   BMI 43.48 kg/m  No intake/output data recorded. No intake/output data recorded.  FHT:  FHR: 130 bpm, variability: moderate,  accelerations:  Present,  decelerations:  Absent UC:   regular, every 3 minutes SVE:   Dilation: 3 Effacement (%): 40 Station: -3 Exam by:: Cyndi LennertHinds, MD  Labs: Lab Results  Component Value Date   WBC 5.2 05/30/2017   HGB 12.8 05/30/2017   HCT 36.8 05/30/2017   MCV 84.2 05/30/2017   PLT 219 05/30/2017    Assessment / Plan: Induction of labor due to gestational diabetes,  progressing well. Cytotec given.   Gestational diabetes: A2GDM on metformin. BGs well controlled. Continue q4 poct glucose, transition to q2 when in active labor. Labor: Progressing normally Fetal Wellbeing:  Category II Pain Control:  Labor support without medications Anticipated MOD:  NSVD  Connie Benson 05/30/2017, 12:05 PM

## 2017-05-31 ENCOUNTER — Encounter (HOSPITAL_COMMUNITY)
Admission: RE | Disposition: A | Discharge status: Home / Self Care | Payer: Self-pay | Source: Ambulatory Visit | Attending: Obstetrics and Gynecology

## 2017-05-31 ENCOUNTER — Inpatient Hospital Stay (HOSPITAL_COMMUNITY): Payer: Medicaid Other | Admitting: Certified Registered Nurse Anesthetist

## 2017-05-31 LAB — GLUCOSE, CAPILLARY: GLUCOSE-CAPILLARY: 70 mg/dL (ref 65–99)

## 2017-05-31 LAB — RPR: RPR: NONREACTIVE

## 2017-05-31 SURGERY — LIGATION, FALLOPIAN TUBE, POSTPARTUM
Anesthesia: Choice

## 2017-05-31 NOTE — Progress Notes (Signed)
Post Partum Day 1 s/p IOL for A2GDM Subjective: no complaints, up ad lib, voiding, tolerating PO and + flatus  Objective: Blood pressure 123/65, pulse 79, temperature 98.4 F (36.9 C), temperature source Oral, resp. rate 18, height 5\' 6"  (1.676 m), weight 269 lb 6.4 oz (122.2 kg), last menstrual period 08/30/2016, unknown if currently breastfeeding.  Physical Exam:  General: alert and no distress Lochia: appropriate Uterine Fundus: firm DVT Evaluation: No evidence of DVT seen on physical exam. Negative Homan's sign. No cords or calf tenderness. No significant calf/ankle edema.  Recent Labs    05/30/17 0710  HGB 12.8  HCT 36.8   Results for orders placed or performed during the hospital encounter of 05/30/17 (from the past 24 hour(s))  Glucose, capillary     Status: None   Collection Time: 05/30/17 11:56 AM  Result Value Ref Range   Glucose-Capillary 88 65 - 99 mg/dL  Glucose, capillary     Status: None   Collection Time: 05/30/17  4:32 PM  Result Value Ref Range   Glucose-Capillary 96 65 - 99 mg/dL  Glucose, capillary     Status: None   Collection Time: 05/31/17  6:09 AM  Result Value Ref Range   Glucose-Capillary 70 65 - 99 mg/dL    Assessment/Plan: Had extensive counseling about desired BTL procedure, patient changed her mind and desires Paragard IUD instead. Procedure cancelled. Regular diet Enourage OOB Breastfeeding, baby doing well at bedside Continue routine postpartum care   LOS: 1 day   Jaynie CollinsUgonna Roscoe Witts, MD 05/31/2017, 10:12 AM

## 2017-05-31 NOTE — Plan of Care (Signed)
POC discussed with pt, no questions or concerns at this time.   

## 2017-05-31 NOTE — Anesthesia Preprocedure Evaluation (Signed)
Anesthesia Evaluation Anesthesia Physical Anesthesia Plan Anesthesia Quick Evaluation  

## 2017-05-31 NOTE — Plan of Care (Signed)
Progressing appropriately. Encouraged to call for assistance with feeding as needed, and [facility]. Safety and Paperwork reviewed.

## 2017-06-01 ENCOUNTER — Other Ambulatory Visit: Payer: Self-pay

## 2017-06-01 MED ORDER — IBUPROFEN 600 MG PO TABS: 600.0000 mg | ORAL_TABLET | Freq: Four times a day (QID) | ORAL | 0 refills | Status: AC

## 2017-06-01 NOTE — Lactation Note (Signed)
This note was copied from a baby's chart. Lactation Consultation Note  Patient Name: Connie Benson JOACZ'YToday's Date: 06/01/2017 Reason for consult: Initial assessment;Term  4138 hours old female who is still being exclusively BF by his mother, she's a P4 and experienced BF, she BF her other kids for a year. Mom feels pretty confident about BF but she still plans to supplement with formula at some point, that was her feeding choice upon admission. Recommended waiting at least 3-4 weeks till BF is well established. Mom verbalized understanding and voiced that she'll be going back to work in 2 months and will try to BF as much as she can.   Taught mom how to had express, it's been a while since she had her last baby. Mom had colostrum squirting out of her breast, she was very pleased. Mom also requested a hand pump to take home since she didn't have any. Lab technician came into the room to take baby's blood sample for his bilirubin, tech recommended mom to do STS, and LC reinforced the benefits of STS during/after painful procedures, suggested mother to nurse baby afterwards to comfort him.  LC assisted with latch in football position, baby open his mouth wide due to crying and was able to sustain the latch, swallows were heard. Mom's nipples look intact upon examination with no signs of trauma.  Encouraged mom to feed baby on cues STS at least 8-12 times/24 hours. Reviewed BF brochure, BF resources and feeding diary. Mom is aware of LC OP services and will contact if needed.  Maternal Data Formula Feeding for Exclusion: Yes Reason for exclusion: Mother's choice to formula and breast feed on admission Has patient been taught Hand Expression?: Yes Does the patient have breastfeeding experience prior to this delivery?: Yes  Feeding Feeding Type: Breast Fed Length of feed: 5 min(Baby was still nursing when exiting the room)  LATCH Score Latch: Grasps breast easily, tongue down, lips flanged,  rhythmical sucking.  Audible Swallowing: A few with stimulation  Type of Nipple: Everted at rest and after stimulation  Comfort (Breast/Nipple): Soft / non-tender  Hold (Positioning): Assistance needed to correctly position infant at breast and maintain latch.  LATCH Score: 8  Interventions Interventions: Breast feeding basics reviewed;Assisted with latch;Skin to skin;Breast massage;Hand express;Breast compression;Adjust position;Support pillows;Position options;Hand pump  Lactation Tools Discussed/Used Tools: Pump Breast pump type: Manual WIC Program: Yes Pump Review: Setup, frequency, and cleaning Initiated by:: MPeck Date initiated:: 06/01/17   Consult Status Consult Status: Complete    Tighe Gitto S Rivka Baune 06/01/2017, 11:45 AM

## 2017-06-01 NOTE — Discharge Summary (Signed)
OB Discharge Summary     Patient Name: Connie Benson DOB: 12/10/1980 MRN: [id number]  Date of admission: 05/30/2017 Delivering MD: Pincus LargePHELPS, JAZMA Y   Date of discharge: 06/01/2017  Admitting diagnosis: INDUCTION Intrauterine pregnancy: 8545w0d     Secondary diagnosis:  Principal Problem:   SVD (spontaneous vaginal delivery) Active Problems:   AMA (advanced maternal age) multigravida 35+   Sickle cell trait (HCC)   GDM, class A2  Additional problems: n/a     Discharge diagnosis: Term Pregnancy Delivered and GDM A2                                                                                                Post partum procedures:n/a  Augmentation: Pitocin and Cytotec  Complications: None  Hospital course:  Induction of Labor With Vaginal Delivery   37 y.o. yo [id number] at 945w0d was admitted to the hospital 05/30/2017 for induction of labor.  Indication for induction: A2 DM.  Patient had an uncomplicated labor course as follows: Membrane Rupture Time/Date: 8:50 PM ,05/30/2017   Intrapartum Procedures: Episiotomy: None [1]                                         Lacerations:  None [1]  Patient had delivery of a Viable infant.  Information for the patient's newborn:  Gifford ShaveDembele, Boy Shanetha [[id number]][[id number]]  Delivery Method: Vaginal, Spontaneous(Filed from Delivery Summary)   05/30/2017  Details of delivery can be found in separate delivery note.  Patient had a routine postpartum course. Patient is discharged home 06/01/17.  Physical exam  Vitals:   05/30/17 2358 05/31/17 0355 05/31/17 1700 06/01/17 0643  BP: (!) 127/57 123/65 115/62 127/80  Pulse: 88 79 89 83  Resp: 18 18 17 18   Temp: 99 F (37.2 C) 98.4 F (36.9 C) 98.1 F (36.7 C) 98.2 F (36.8 C)  TempSrc: Oral Oral Oral Oral  SpO2:   100%   Weight:      Height:       General: alert, cooperative and no distress Lochia: appropriate Uterine Fundus: firm Incision: N/A DVT Evaluation: No evidence of DVT seen on physical  exam. Labs: Lab Results  Component Value Date   WBC 5.2 05/30/2017   HGB 12.8 05/30/2017   HCT 36.8 05/30/2017   MCV 84.2 05/30/2017   PLT 219 05/30/2017   No flowsheet data found.  Discharge instruction: per After Visit Summary and "Baby and Me Booklet".  After visit meds:  Allergies as of 06/01/2017      Reactions   Pineapple Swelling   Swelling in mouth      Medication List    STOP taking these medications   ACCU-CHEK FASTCLIX LANCETS Misc   glucose blood test strip   metFORMIN 500 MG tablet Commonly known as:  GLUCOPHAGE     TAKE these medications   CONCEPT OB 130-92.4-1 MG Caps Take 1 tablet by mouth daily.   ibuprofen 600 MG tablet Commonly known as:  ADVIL,MOTRIN Take 1 tablet (600 mg  total) by mouth every 6 (six) hours.       Diet: carb modified diet  Activity: Advance as tolerated. Pelvic rest for 6 weeks.   Outpatient follow up:4 weeks Follow up Appt:No future appointments. Follow up Visit:No follow-ups on file.  Postpartum contraception: IUD Paragard  Newborn Data: Live born female  Birth Weight: 7 lb 10.2 oz (3464 g) APGAR: 8, 9  Newborn Delivery   Birth date/time:  05/30/2017 20:50:00 Delivery type:  Vaginal, Spontaneous     Baby Feeding: Breast Disposition:home with mother   06/01/2017 Rolm Bookbinder, CNM

## 2017-06-01 NOTE — Discharge Instructions (Signed)
Vaginal Delivery, Care After °Refer to this sheet in the next few weeks. These instructions provide you with information about caring for yourself after vaginal delivery. Your health care provider may also give you more specific instructions. Your treatment has been planned according to current medical practices, but problems sometimes occur. Call your health care provider if you have any problems or questions. °What can I expect after the procedure? °After vaginal delivery, it is common to have: °· Some bleeding from your vagina. °· Soreness in your abdomen, your vagina, and the area of skin between your vaginal opening and your anus (perineum). °· Pelvic cramps. °· Fatigue. ° °Follow these instructions at home: °Medicines °· Take over-the-counter and prescription medicines only as told by your health care provider. °· If you were prescribed an antibiotic medicine, take it as told by your health care provider. Do not stop taking the antibiotic until it is finished. °Driving ° °· Do not drive or operate heavy machinery while taking prescription pain medicine. °· Do not drive for 24 hours if you received a sedative. °Lifestyle °· Do not drink alcohol. This is especially important if you are breastfeeding or taking medicine to relieve pain. °· Do not use tobacco products, including cigarettes, chewing tobacco, or e-cigarettes. If you need help quitting, ask your health care provider. °Eating and drinking °· Drink at least 8 eight-ounce glasses of water every day unless you are told not to by your health care provider. If you choose to breastfeed your baby, you may need to drink more water than this. °· Eat high-fiber foods every day. These foods may help prevent or relieve constipation. High-fiber foods include: °? Whole grain cereals and breads. °? Brown rice. °? Beans. °? Fresh fruits and vegetables. °Activity °· Return to your normal activities as told by your health care provider. Ask your health care provider  what activities are safe for you. °· Rest as much as possible. Try to rest or take a nap when your baby is sleeping. °· Do not lift anything that is heavier than your baby or 10 lb (4.5 kg) until your health care provider says that it is safe. °· Talk with your health care provider about when you can engage in sexual activity. This may depend on your: °? Risk of infection. °? Rate of healing. °? Comfort and desire to engage in sexual activity. °Vaginal Care °· If you have an episiotomy or a vaginal tear, check the area every day for signs of infection. Check for: °? More redness, swelling, or pain. °? More fluid or blood. °? Warmth. °? Pus or a bad smell. °· Do not use tampons or douches until your health care provider says this is safe. °· Watch for any blood clots that may pass from your vagina. These may look like clumps of dark red, brown, or black discharge. °General instructions °· Keep your perineum clean and dry as told by your health care provider. °· Wear loose, comfortable clothing. °· Wipe from front to back when you use the toilet. °· Ask your health care provider if you can shower or take a bath. If you had an episiotomy or a perineal tear during labor and delivery, your health care provider may tell you not to take baths for a certain length of time. °· Wear a bra that supports your breasts and fits you well. °· If possible, have someone help you with household activities and help care for your baby for at least a few days after   you leave the hospital. °· Keep all follow-up visits for you and your baby as told by your health care provider. This is important. °Contact a health care provider if: °· You have: °? Vaginal discharge that has a bad smell. °? Difficulty urinating. °? Pain when urinating. °? A sudden increase or decrease in the frequency of your bowel movements. °? More redness, swelling, or pain around your episiotomy or vaginal tear. °? More fluid or blood coming from your episiotomy or  vaginal tear. °? Pus or a bad smell coming from your episiotomy or vaginal tear. °? A fever. °? A rash. °? Little or no interest in activities you used to enjoy. °? Questions about caring for yourself or your baby. °· Your episiotomy or vaginal tear feels warm to the touch. °· Your episiotomy or vaginal tear is separating or does not appear to be healing. °· Your breasts are painful, hard, or turn red. °· You feel unusually sad or worried. °· You feel nauseous or you vomit. °· You pass large blood clots from your vagina. If you pass a blood clot from your vagina, save it to show to your health care provider. Do not flush blood clots down the toilet without having your health care provider look at them. °· You urinate more than usual. °· You are dizzy or light-headed. °· You have not breastfed at all and you have not had a menstrual period for 12 weeks after delivery. °· You have stopped breastfeeding and you have not had a menstrual period for 12 weeks after you stopped breastfeeding. °Get help right away if: °· You have: °? Pain that does not go away or does not get better with medicine. °? Chest pain. °? Difficulty breathing. °? Blurred vision or spots in your vision. °? Thoughts about hurting yourself or your baby. °· You develop pain in your abdomen or in one of your legs. °· You develop a severe headache. °· You faint. °· You bleed from your vagina so much that you fill two sanitary pads in one hour. °This information is not intended to replace advice given to you by your health care provider. Make sure you discuss any questions you have with your health care provider. °Document Released: 02/16/2000 Document Revised: 08/02/2015 Document Reviewed: 03/05/2015 °Elsevier Interactive Patient Education © 2018 [employer] ° °

## 2017-06-02 LAB — GLUCOSE, CAPILLARY: Glucose-Capillary: 89 mg/dL (ref 65–99)

## 2017-07-07 ENCOUNTER — Other Ambulatory Visit: Payer: Self-pay | Admitting: General Practice

## 2017-07-07 DIAGNOSIS — O24415 Gestational diabetes mellitus in pregnancy, controlled by oral hypoglycemic drugs: Secondary | ICD-10-CM

## 2017-07-08 ENCOUNTER — Other Ambulatory Visit: Payer: Medicaid Other

## 2017-07-08 ENCOUNTER — Ambulatory Visit (INDEPENDENT_AMBULATORY_CARE_PROVIDER_SITE_OTHER): Payer: Medicaid Other | Admitting: Student

## 2017-07-08 ENCOUNTER — Encounter: Payer: Self-pay | Admitting: Student

## 2017-07-08 VITALS — BP 122/74 | HR 74 | Wt 269.9 lb

## 2017-07-08 DIAGNOSIS — O24415 Gestational diabetes mellitus in pregnancy, controlled by oral hypoglycemic drugs: Secondary | ICD-10-CM

## 2017-07-08 DIAGNOSIS — Z3009 Encounter for other general counseling and advice on contraception: Secondary | ICD-10-CM

## 2017-07-08 DIAGNOSIS — Z3480 Encounter for supervision of other normal pregnancy, unspecified trimester: Secondary | ICD-10-CM

## 2017-07-08 DIAGNOSIS — O24419 Gestational diabetes mellitus in pregnancy, unspecified control: Secondary | ICD-10-CM

## 2017-07-08 NOTE — Progress Notes (Signed)
Subjective:     Connie Benson is a 37 y.o. female who presents for a postpartum visit. She is 5 weeks postpartum following a spontaneous vaginal delivery. I have fully reviewed the prenatal and intrapartum course. The delivery was at 39 gestational weeks. Outcome: spontaneous vaginal delivery. Anesthesia: none. Postpartum course has been unremarkable. Baby's course has been unremarkable. Baby is feeding by breast. Bleeding no bleeding. Bowel function is normal. Bladder function is normal. Patient is not sexually active. Contraception method is none. Postpartum depression screening: negative.  The following portions of the patient's history were reviewed and updated as appropriate: allergies, current medications, past family history, past medical history, past social history, past surgical history and problem list.  Review of Systems Pertinent items are noted in HPI.   Objective:    BP 122/74   Pulse 74   Wt 269 lb 14.4 oz (122.4 kg)   LMP 08/30/2016 (Exact Date)   Breastfeeding? Yes   BMI 43.56 kg/m   General:  alert, cooperative and appears stated age  Lungs: clear to auscultation bilaterally  Heart:  regular rate and rhythm, S1, S2 normal, no murmur, click, rub or gallop  Abdomen: soft, non-tender; bowel sounds normal; no masses,  no organomegaly        Assessment:     Normal postpartum exam. Pap smear not done at today's visit.  Pt doing well. Wants paragard. No paragards in stock today. Will bring patient back asap to place paragard Plan:   1. Supervision of other normal pregnancy, antepartum -bring pt back asap when paragard in stock  2. GDM, class A2 -PP 2hr gtt collected today  Judeth Horn, NP

## 2017-07-08 NOTE — Patient Instructions (Signed)

## 2017-07-09 LAB — GLUCOSE TOLERANCE, 2 HOURS
Glucose, 2 hour: 95 mg/dL (ref 65–139)
Glucose, GTT - Fasting: 91 mg/dL (ref 65–99)

## 2017-07-16 ENCOUNTER — Ambulatory Visit (INDEPENDENT_AMBULATORY_CARE_PROVIDER_SITE_OTHER): Payer: Medicaid Other

## 2017-07-16 VITALS — BP 128/80 | HR 70 | Ht 66.0 in | Wt 270.6 lb

## 2017-07-16 DIAGNOSIS — Z3202 Encounter for pregnancy test, result negative: Secondary | ICD-10-CM

## 2017-07-16 DIAGNOSIS — Z30430 Encounter for insertion of intrauterine contraceptive device: Secondary | ICD-10-CM

## 2017-07-16 LAB — POCT PREGNANCY, URINE: PREG TEST UR: NEGATIVE

## 2017-07-16 MED ORDER — PARAGARD INTRAUTERINE COPPER IU IUD
1.0000 | INTRAUTERINE_SYSTEM | Freq: Once | INTRAUTERINE | Status: AC
  Administered 2017-07-16: 1 via INTRAUTERINE

## 2017-07-16 NOTE — Progress Notes (Signed)
    GYNECOLOGY OFFICE PROCEDURE NOTE  Connie Benson is a 37 y.o. [id number] here for Liletta IUD insertion. No GYN concerns.  Last pap smear was on 11/2016 and was normal.  IUD Insertion Procedure Note Patient identified, informed consent performed, consent signed.   Discussed risks of irregular bleeding, cramping, infection, malpositioning or misplacement of the IUD outside the uterus which may require further procedure such as laparoscopy. Time out was performed.  Urine pregnancy test negative.  Speculum placed in the vagina.  Cervix visualized.  Cleaned with Betadine x 2.  Grasped anteriorly with a single tooth tenaculum.  Uterus sounded to 6 cm.  ParaGuard IUD placed per manufacturer's recommendations.  Strings trimmed to 3 cm. Tenaculum was removed, good hemostasis noted.  Patient tolerated procedure well.   Patient was given post-procedure instructions.  She was advised to have backup contraception for one week.  Patient was also asked to check IUD strings periodically and follow up in 4 weeks for IUD check.  Rolm Bookbinder, CNM 07/16/17 8:52 AM

## 2017-07-16 NOTE — Patient Instructions (Signed)
Intrauterine Device Information An intrauterine device (IUD) is inserted into your uterus to prevent pregnancy. There are two types of IUDs available:  Copper IUD-This type of IUD is wrapped in copper wire and is placed inside the uterus. Copper makes the uterus and fallopian tubes produce a fluid that kills sperm. The copper IUD can stay in place for 10 years.  Hormone IUD-This type of IUD contains the hormone progestin (synthetic progesterone). The hormone thickens the cervical mucus and prevents sperm from entering the uterus. It also thins the uterine lining to prevent implantation of a fertilized egg. The hormone can weaken or kill the sperm that get into the uterus. One type of hormone IUD can stay in place for 5 years, and another type can stay in place for 3 years.  Your health care provider will make sure you are a good candidate for a contraceptive IUD. Discuss with your health care provider the possible side effects. Advantages of an intrauterine device  IUDs are highly effective, reversible, long acting, and low maintenance.  There are no estrogen-related side effects.  An IUD can be used when breastfeeding.  IUDs are not associated with weight gain.  The copper IUD works immediately after insertion.  The hormone IUD works right away if inserted within 7 days of your period starting. You will need to use a backup method of birth control for 7 days if the hormone IUD is inserted at any other time in your cycle.  The copper IUD does not interfere with your female hormones.  The hormone IUD can make heavy menstrual periods lighter and decrease cramping.  The hormone IUD can be used for 3 or 5 years.  The copper IUD can be used for 10 years. Disadvantages of an intrauterine device  The hormone IUD can be associated with irregular bleeding patterns.  The copper IUD can make your menstrual flow heavier and more painful.  You may experience cramping and vaginal bleeding after  insertion. This information is not intended to replace advice given to you by your health care provider. Make sure you discuss any questions you have with your health care provider. Document Released: 01/23/2004 Document Revised: 07/27/2015 Document Reviewed: 08/09/2012 Elsevier Interactive Patient Education  2017 [employer] IUD PLACEMENT POST-PROCEDURE INSTRUCTIONS  1. You may take Ibuprofen, Aleve or Tylenol for pain if needed.  Cramping should resolve within in 24 hours.  2. You may have a small amount of spotting.  You should wear a mini pad for the next few days.  3. You may have intercourse after 24 hours.  If you using this for birth control, it is effective immediately.  4. You need to call if you have any pelvic pain, fever, heavy bleeding or foul smelling vaginal discharge.  Irregular bleeding is common the first several months after having an IUD placed. You do not need to call for this reason unless you are concerned.  5. Shower or bathe as normal  6. You should have a follow-up appointment in 4-8 weeks for a re-check to make sure you are not having any problems. 

## 2017-07-16 NOTE — Addendum Note (Signed)
Addended by: Faythe Casa on: 07/16/2017 10:49 AM   Modules accepted: Orders

## 2017-07-23 ENCOUNTER — Encounter: Payer: Self-pay | Admitting: *Deleted

## 2017-08-27 ENCOUNTER — Ambulatory Visit (INDEPENDENT_AMBULATORY_CARE_PROVIDER_SITE_OTHER): Payer: Medicaid Other | Admitting: Medical

## 2017-08-27 ENCOUNTER — Encounter: Payer: Self-pay | Admitting: Medical

## 2017-08-27 VITALS — BP 127/76 | HR 73 | Ht 66.0 in | Wt 274.8 lb

## 2017-08-27 DIAGNOSIS — Z30431 Encounter for routine checking of intrauterine contraceptive device: Secondary | ICD-10-CM | POA: Diagnosis present

## 2017-08-27 NOTE — Progress Notes (Signed)
History:  [name] is a 37 y.o. [id number] who presents to clinic today for IUD string check. She had Paragard IUD placed 07/16/17. She denies bleeding today. She has had minimal pain. She has no other complaints today.    The following portions of the patient's history were reviewed and updated as appropriate: allergies, current medications, family history, past medical history, social history, past surgical history and problem list.  Review of Systems:  Review of Systems  Constitutional: Negative for fever.  Gastrointestinal: Negative for abdominal pain.  Genitourinary:       Neg - vaginal bleeding, abnormal discharge      Objective:  Physical Exam BP 127/76   Pulse 73   Ht 5\' 6"  (1.676 m)   Wt 274 lb 12.8 oz (124.6 kg)   Breastfeeding? Yes   BMI 44.35 kg/m  Physical Exam  Constitutional: She appears well-developed and well-nourished.  Cardiovascular: Normal rate.  Pulmonary/Chest: Effort normal.  Abdominal: Soft.  Genitourinary: Cervix exhibits no friability. No bleeding in the vagina. Vaginal discharge (small) found.  Genitourinary Comments: IUD strings are visualized and appear appropriate length  Skin: Skin is warm and dry. No erythema.  Psychiatric: She has a normal mood and affect.  Vitals reviewed.   Assessment & Plan:  1. IUD check up - IUD in place, follow-up with [facility] as needed or for next annual exam   Kathlene CoteWenzel, Julie N, PA-C 08/27/2017 9:26 AM

## 2017-08-27 NOTE — Patient Instructions (Signed)

## 2019-05-20 IMAGING — US US MFM OB FOLLOW-UP
1 series · 13 of 28 positions shown · non-contrast
Comparison: none

1  SOUNITABYE ADNATH              [id number]      [id number]     [id number]
Indications

37 weeks gestation of pregnancy
Encounter for other antenatal screening
follow-up
History of sickle cell trait
Advanced maternal age multigravida 35+,
third trimester (low risk NIPS)
Obesity complicating pregnancy, third
trimester
Gestational diabetes in pregnancy, diet
controlled
OB History
Gravidity:    5         Term:   3         SAB:   1
Fetal Evaluation
Num Of Fetuses:     1
Fetal Heart         141
Rate(bpm):
Cardiac Activity:   Observed
Presentation:       Cephalic
Placenta:           Posterior, above cervical os
P. Cord Insertion:  Previously Visualized
Amniotic Fluid
AFI FV:      Subjectively within normal limits
AFI Sum(cm)     %Tile       Largest Pocket(cm)
16.32           62
RUQ(cm)       RLQ(cm)       LUQ(cm)        LLQ(cm)
4.84
Biophysical Evaluation
Amniotic F.V:   Within normal limits       F. Tone:        Observed
F. Movement:    Observed                   Score:          [DATE]
F. Breathing:   Observed
Biometry
BPD:      88.5  mm     G. Age:  35w 6d         23  %    CI:        77.38   %    70 - 86
FL/HC:      24.1   %    20.8 -
HC:      318.5  mm     G. Age:  35w 6d          5  %    HC/AC:      0.90        0.92 -
AC:      354.3  mm     G. Age:  39w 2d         96  %    FL/BPD:     86.8   %    71 - 87
FL:       76.8  mm     G. Age:  39w 2d         89  %    FL/AC:      21.7   %    20 - 24
HUM:      63.6  mm     G. Age:  36w 6d         63  %
Est. FW:    1111  gm    7 lb 11 oz      88  %
Gestational Age
LMP:           37w 3d        Date:  08/30/16                 EDD:   06/06/17
U/S Today:     37w 4d                                        EDD:   06/05/17
Best:          37w 3d     Det. By:  LMP  (08/30/16)          EDD:   06/06/17
Anatomy
Cranium:               Appears normal         LVOT:                   Appears normal
Cavum:                 Appears normal         Aortic Arch:            Previously seen
Ventricles:            Appears normal         Ductal Arch:            Previously seen
Choroid Plexus:        Previously seen        Diaphragm:              Appears normal
Cerebellum:            Previously seen        Stomach:                Appears normal, left
sided
Posterior Fossa:       Previously seen        Abdomen:                Appears normal
Nuchal Fold:           Previously seen        Abdominal Wall:         Previously seen
Face:                  Profile nl; orbits     Cord Vessels:           Previously seen
prev seen
Lips:                  Previously seen        Kidneys:                Appear normal
Palate:                Previously seen        Bladder:                Appears normal
Thoracic:              Appears normal         Spine:                  Previously seen
Heart:                 Appears normal         Upper Extremities:      Previously seen
(4CH, axis, and situs
RVOT:                  Appears normal         Lower Extremities:      Previously seen
Other:  Male gender. Heels and 5th digit previously seen. Nasal bone
previously visualized.
Cervix Uterus Adnexa
Cervix
Not visualized (advanced GA >79wks)
Impression
INDICATION: 37 yr old [id number] at 07w0d with gestational
diabetes A2 for fetal growth and BPP.

[Series 1: us mfm ob follow-up · 42 acquisitions, 13 frames shown]
[im 2/42]
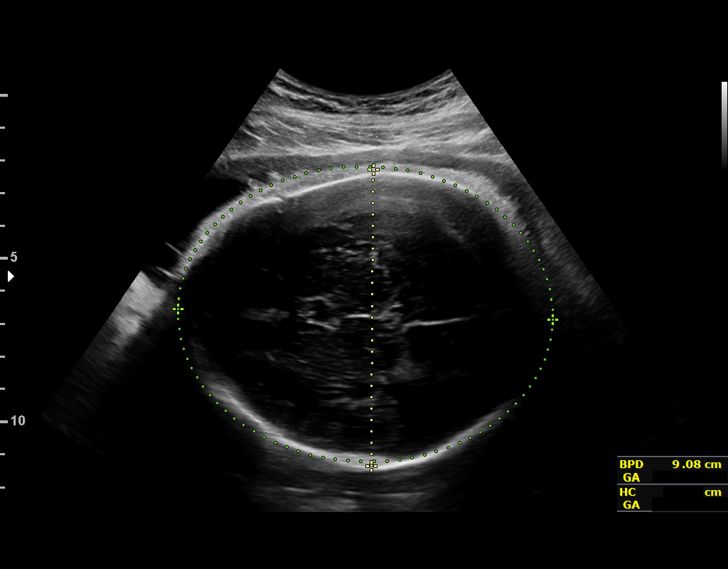
[im 5/42]
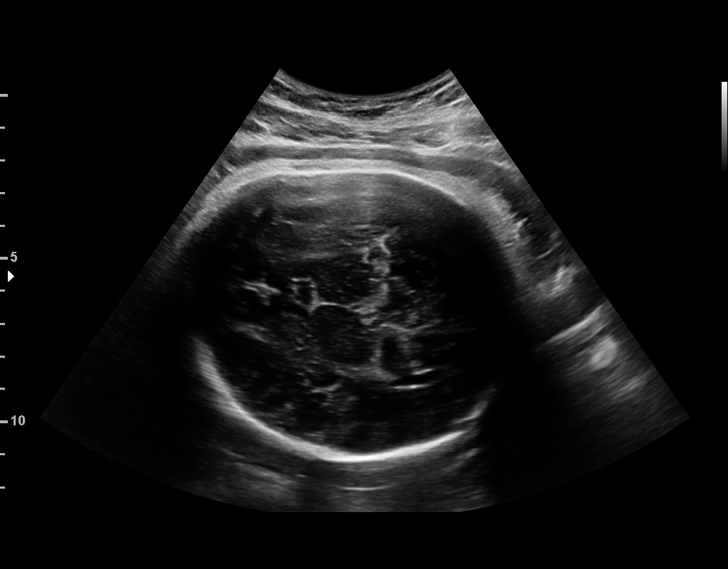
[im 8/42]
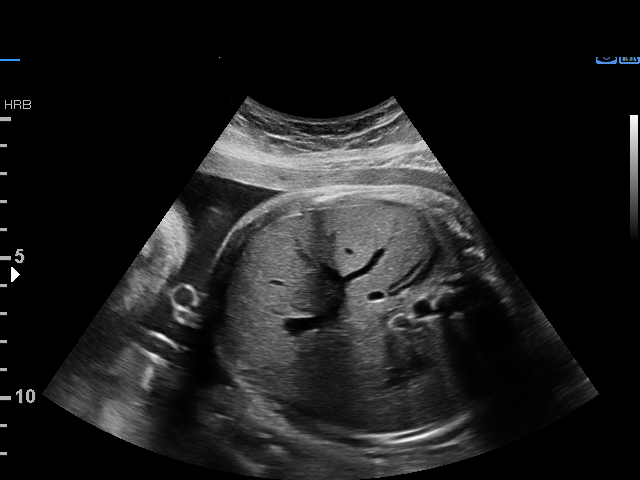
[im 11/42]
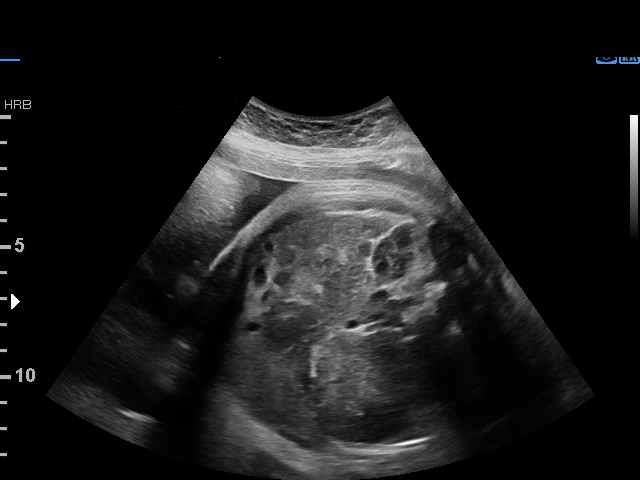
[im 14/42]
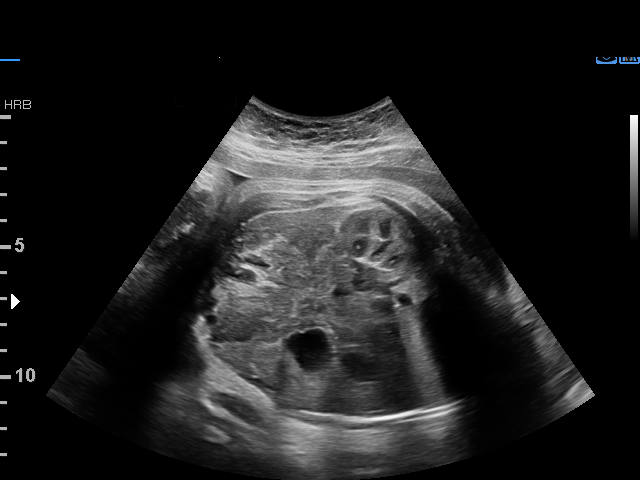
[im 17/42]
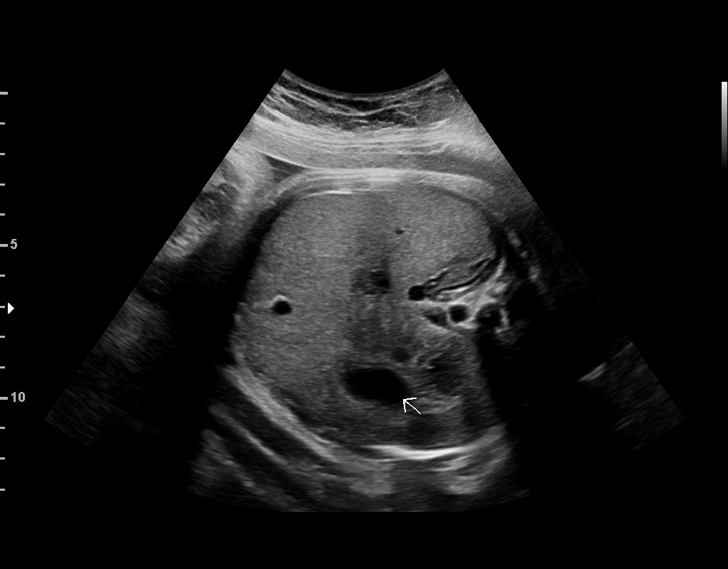
[im 22/42]
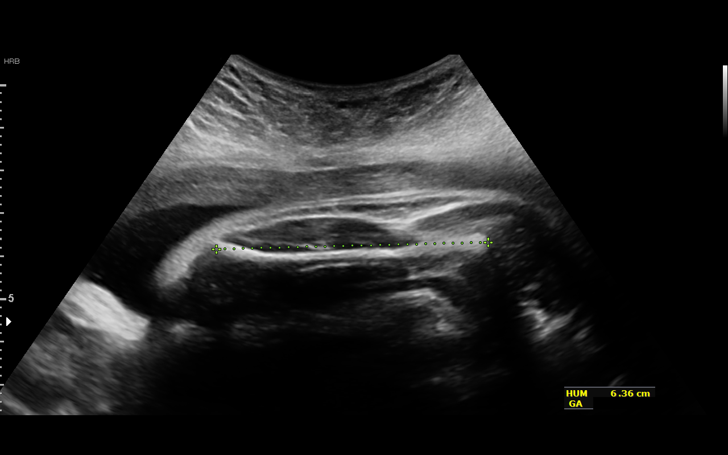
[im 25/42]
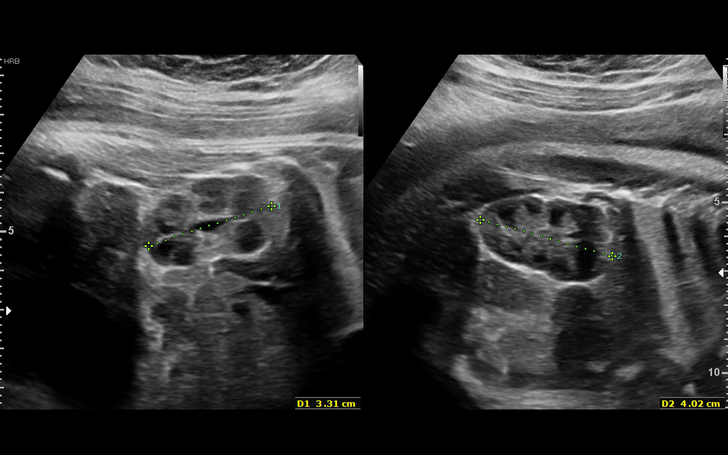
[im 28/42]
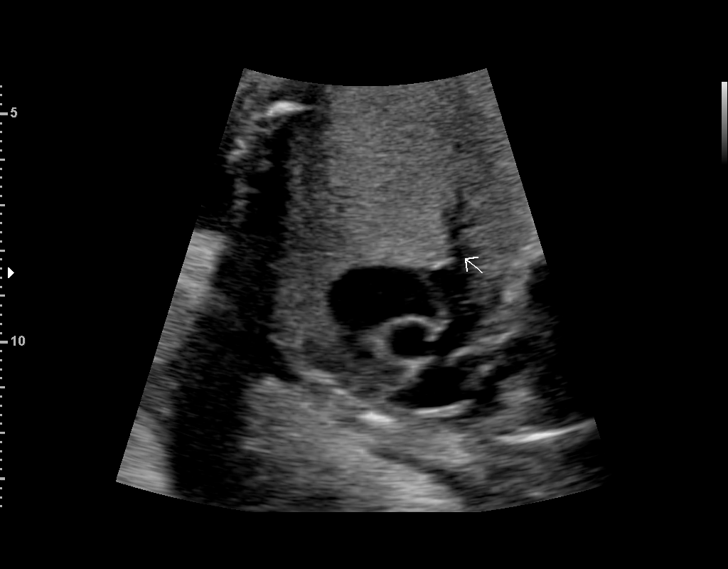
[im 31/42]
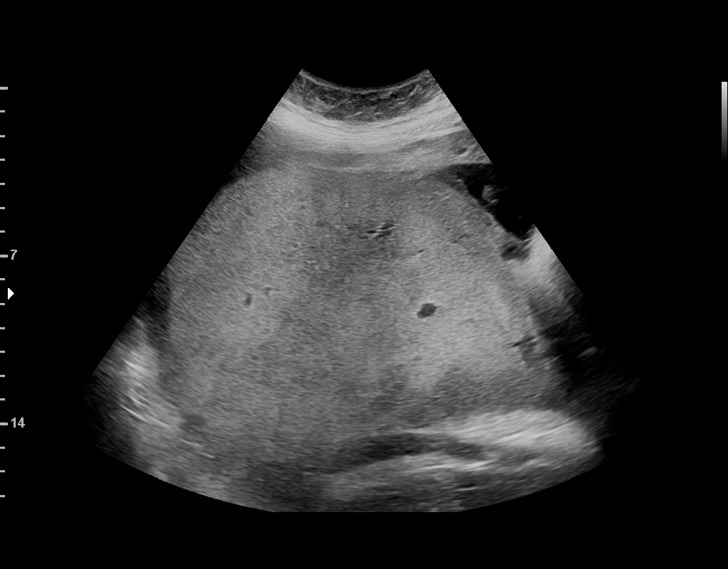
[im 34/42]
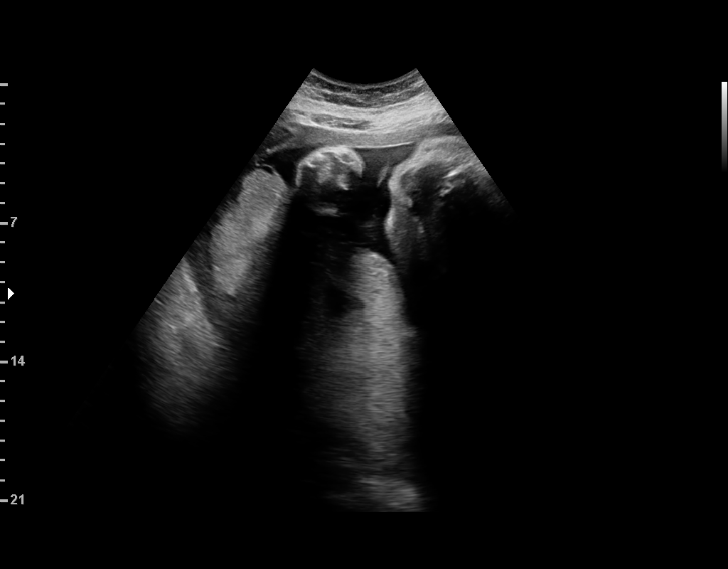
[im 37/42]
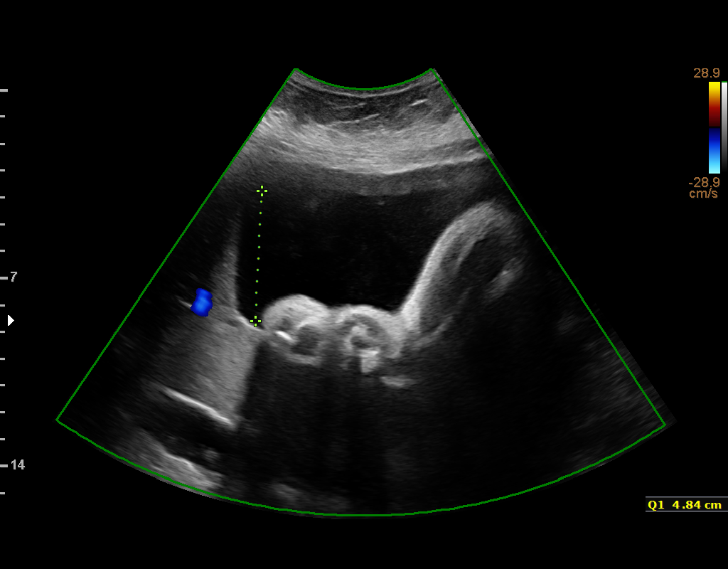
[im 40/42]
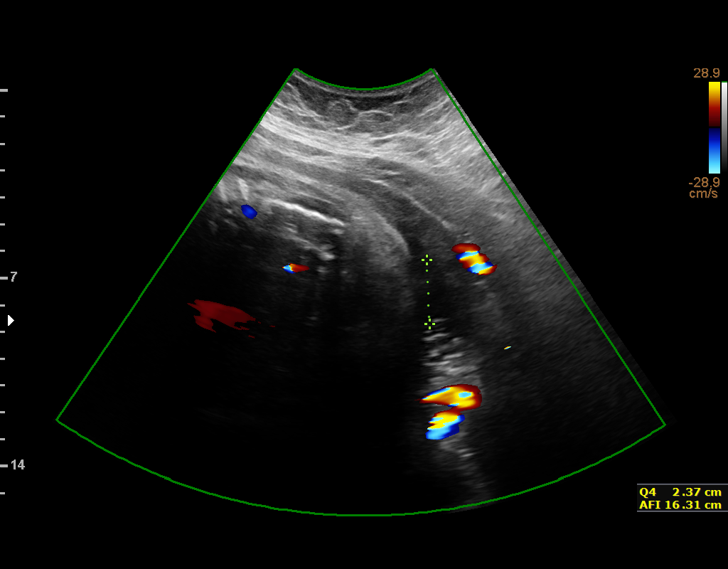

[13 of 28 positions shown; findings below may reference images not displayed]

FINDINGS: 1. Single intrauterine pregnancy with normal cardiac activity.
2. Estimated fetal weight is in the 88th%.
3. Posterior placenta without evidence of previa.
4. Normal amniotic fluid index.
5. The limited anatomy survey is normal.
6. Normal biophysical profile of [DATE].
Recommendations

1. Appropriate fetal growth.
2. Gestational diabetes:
- on metformin
- recommend continue antenatal testing
- recommend delivery at 39-40 weeks or sooner if clinically
indicated
3. Advanced maternal age:
- low risk cell free fetal DNA

## 2020-09-18 ENCOUNTER — Other Ambulatory Visit: Payer: Self-pay | Admitting: Internal Medicine

## 2020-09-19 LAB — COMPLETE METABOLIC PANEL WITH GFR
AG Ratio: 1.6 (calc) (ref 1.0–2.5)
ALT: 16 U/L (ref 6–29)
AST: 16 U/L (ref 10–30)
Albumin: 4.1 g/dL (ref 3.6–5.1)
Alkaline phosphatase (APISO): 77 U/L (ref 31–125)
BUN: 7 mg/dL (ref 7–25)
CO2: 25 mmol/L (ref 20–32)
Calcium: 9.1 mg/dL (ref 8.6–10.2)
Chloride: 105 mmol/L (ref 98–110)
Creat: 0.78 mg/dL (ref 0.50–0.99)
Globulin: 2.5 g/dL (calc) (ref 1.9–3.7)
Glucose, Bld: 82 mg/dL (ref 65–99)
Potassium: 3.9 mmol/L (ref 3.5–5.3)
Sodium: 140 mmol/L (ref 135–146)
Total Bilirubin: 0.5 mg/dL (ref 0.2–1.2)
Total Protein: 6.6 g/dL (ref 6.1–8.1)
eGFR: 98 mL/min/{1.73_m2} (ref >=60)

## 2020-09-19 LAB — CBC
HCT: 37.6 % (ref 35.0–45.0)
Hemoglobin: 11.8 g/dL (ref 11.7–15.5)
MCH: 26.5 pg — ABNORMAL LOW (ref 27.0–33.0)
MCHC: 31.4 g/dL — ABNORMAL LOW (ref 32.0–36.0)
MCV: 84.3 fL (ref 80.0–100.0)
MPV: 9.8 fL (ref 7.5–12.5)
Platelets: 379 10*3/uL (ref 140–400)
RBC: 4.46 10*6/uL (ref 3.80–5.10)
RDW: 15.4 % — ABNORMAL HIGH (ref 11.0–15.0)
WBC: 5.8 10*3/uL (ref 3.8–10.8)

## 2020-09-19 LAB — LIPID PANEL
Cholesterol: 168 mg/dL (ref <200)
HDL: 51 mg/dL (ref >=50)
LDL Cholesterol (Calc): 98 mg/dL (calc)
Non-HDL Cholesterol (Calc): 117 mg/dL (calc) (ref <130)
Total CHOL/HDL Ratio: 3.3 (calc) (ref <5.0)
Triglycerides: 97 mg/dL (ref <150)

## 2020-09-19 LAB — HEMOGLOBIN A1C W/OUT EAG: Hgb A1c MFr Bld: 5.7 % of total Hgb — ABNORMAL HIGH (ref <5.7)

## 2020-09-19 LAB — TSH: TSH: 1.15 mIU/L

## 2020-12-04 ENCOUNTER — Other Ambulatory Visit: Payer: Self-pay | Admitting: Internal Medicine

## 2020-12-04 DIAGNOSIS — Z1231 Encounter for screening mammogram for malignant neoplasm of breast: Secondary | ICD-10-CM

## 2021-01-02 ENCOUNTER — Ambulatory Visit
Admission: RE | Admit: 2021-01-02 | Discharge: 2021-01-02 | Disposition: A | Discharge status: Home / Self Care | Payer: Medicaid Other | Source: Ambulatory Visit | Attending: Internal Medicine | Admitting: Internal Medicine

## 2021-01-02 ENCOUNTER — Other Ambulatory Visit: Payer: Self-pay

## 2021-01-02 DIAGNOSIS — Z1231 Encounter for screening mammogram for malignant neoplasm of breast: Secondary | ICD-10-CM

## 2022-02-01 DIAGNOSIS — Z419 Encounter for procedure for purposes other than remedying health state, unspecified: Secondary | ICD-10-CM | POA: Diagnosis not present

## 2022-03-04 DIAGNOSIS — Z419 Encounter for procedure for purposes other than remedying health state, unspecified: Secondary | ICD-10-CM | POA: Diagnosis not present

## 2022-04-04 DIAGNOSIS — Z419 Encounter for procedure for purposes other than remedying health state, unspecified: Secondary | ICD-10-CM | POA: Diagnosis not present

## 2022-05-03 DIAGNOSIS — Z419 Encounter for procedure for purposes other than remedying health state, unspecified: Secondary | ICD-10-CM | POA: Diagnosis not present

## 2022-06-03 DIAGNOSIS — Z419 Encounter for procedure for purposes other than remedying health state, unspecified: Secondary | ICD-10-CM | POA: Diagnosis not present

## 2022-06-26 ENCOUNTER — Other Ambulatory Visit: Payer: Self-pay

## 2022-07-03 DIAGNOSIS — Z419 Encounter for procedure for purposes other than remedying health state, unspecified: Secondary | ICD-10-CM | POA: Diagnosis not present

## 2022-08-03 DIAGNOSIS — Z419 Encounter for procedure for purposes other than remedying health state, unspecified: Secondary | ICD-10-CM | POA: Diagnosis not present

## 2022-08-07 ENCOUNTER — Ambulatory Visit: Payer: Medicaid Other | Admitting: Medical

## 2022-09-02 DIAGNOSIS — Z419 Encounter for procedure for purposes other than remedying health state, unspecified: Secondary | ICD-10-CM | POA: Diagnosis not present

## 2022-10-03 DIAGNOSIS — Z419 Encounter for procedure for purposes other than remedying health state, unspecified: Secondary | ICD-10-CM | POA: Diagnosis not present

## 2022-11-03 DIAGNOSIS — Z419 Encounter for procedure for purposes other than remedying health state, unspecified: Secondary | ICD-10-CM | POA: Diagnosis not present

## 2022-12-03 DIAGNOSIS — Z419 Encounter for procedure for purposes other than remedying health state, unspecified: Secondary | ICD-10-CM | POA: Diagnosis not present

## 2023-01-03 DIAGNOSIS — Z419 Encounter for procedure for purposes other than remedying health state, unspecified: Secondary | ICD-10-CM | POA: Diagnosis not present

## 2023-01-03 IMAGING — MG MM DIGITAL SCREENING BILAT W/ TOMO AND CAD
8 series · 8 of 24 positions shown · non-contrast
Comparison: None.

CLINICAL DATA: Screening.

EXAM:
DIGITAL SCREENING BILATERAL MAMMOGRAM WITH TOMOSYNTHESIS AND CAD
TECHNIQUE: Bilateral screening digital craniocaudal and mediolateral oblique
mammograms were obtained. Bilateral screening digital breast
tomosynthesis was performed. The images were evaluated with
computer-aided detection.

[R MLO synth-2D]
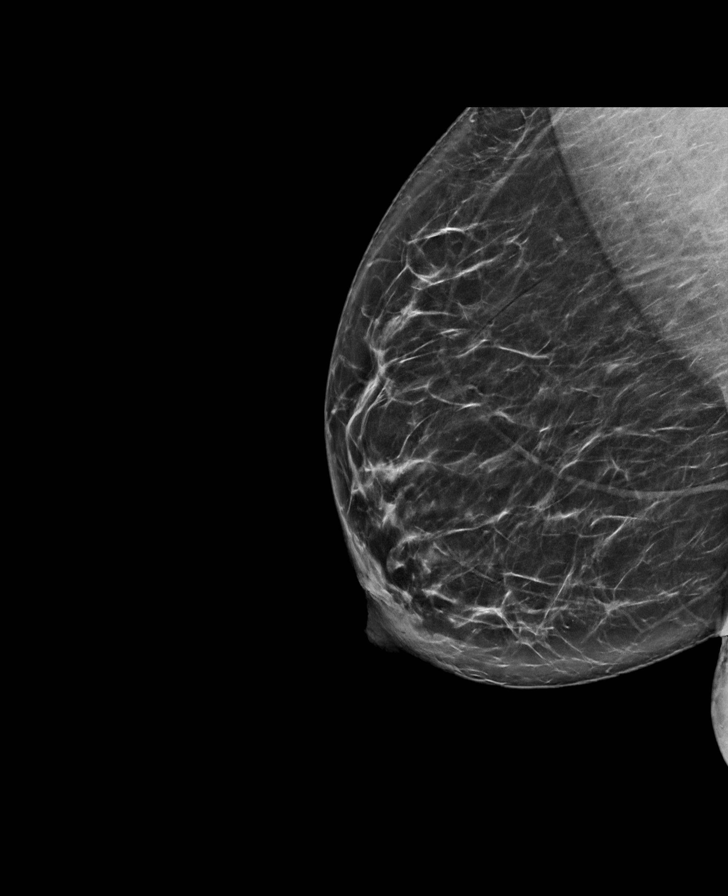

[L MLO synth-2D]
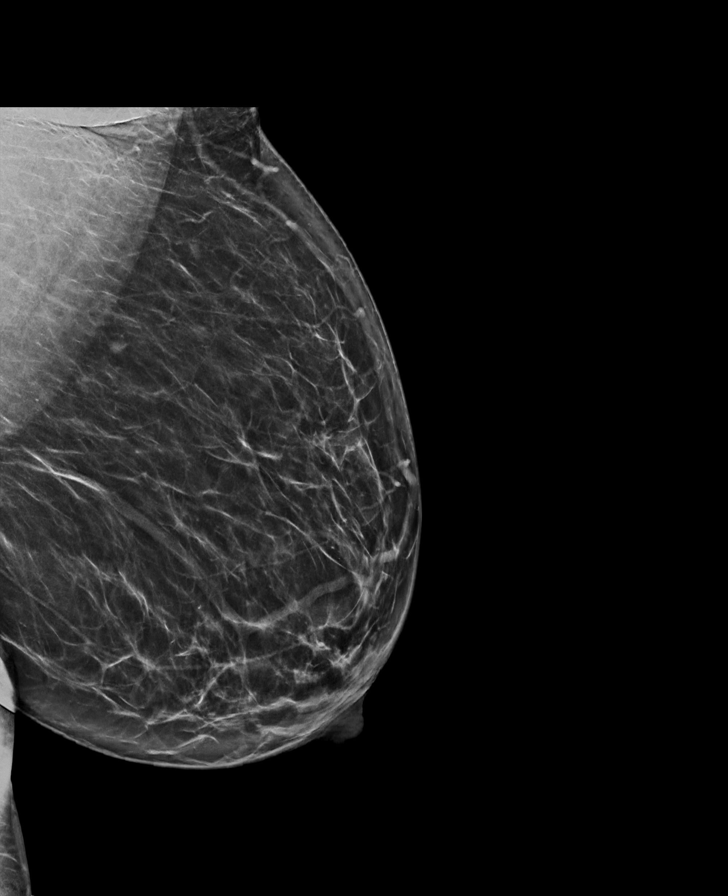

[R CC synth-2D]
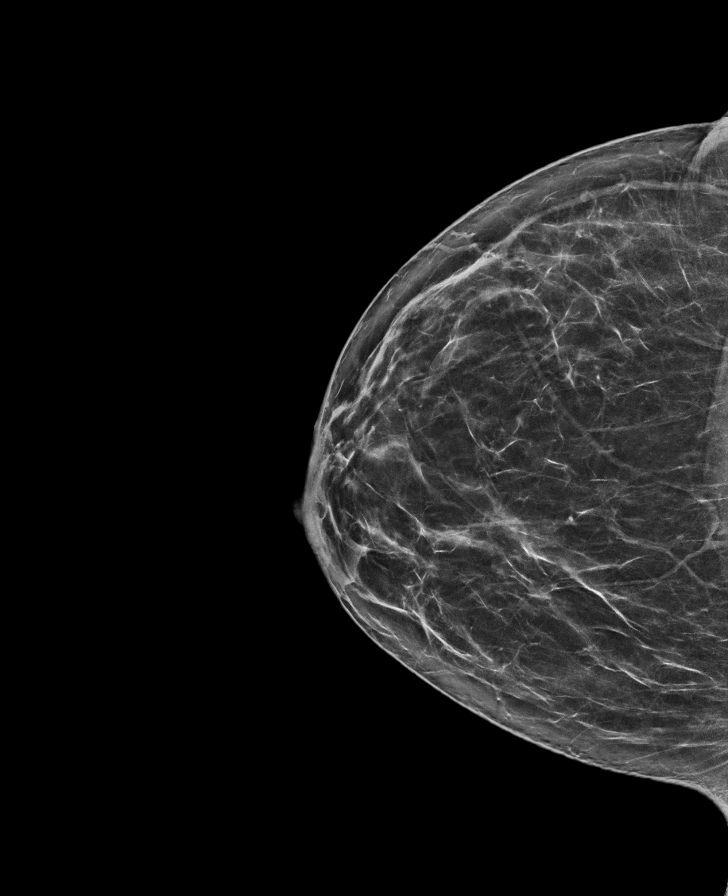

[L CC synth-2D]
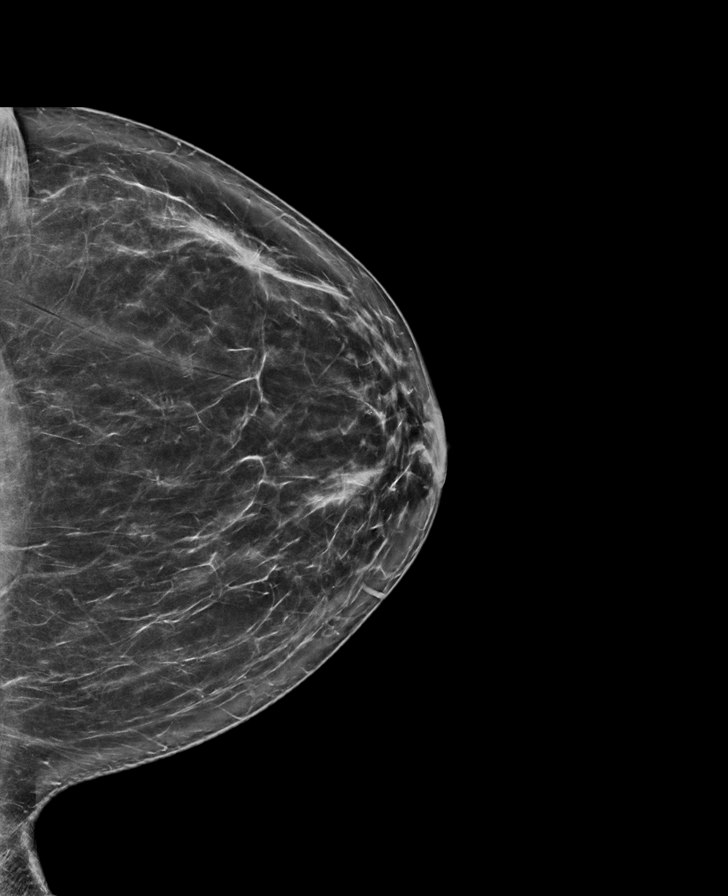

[L MLO tomo · tomo slice 41/81.0]
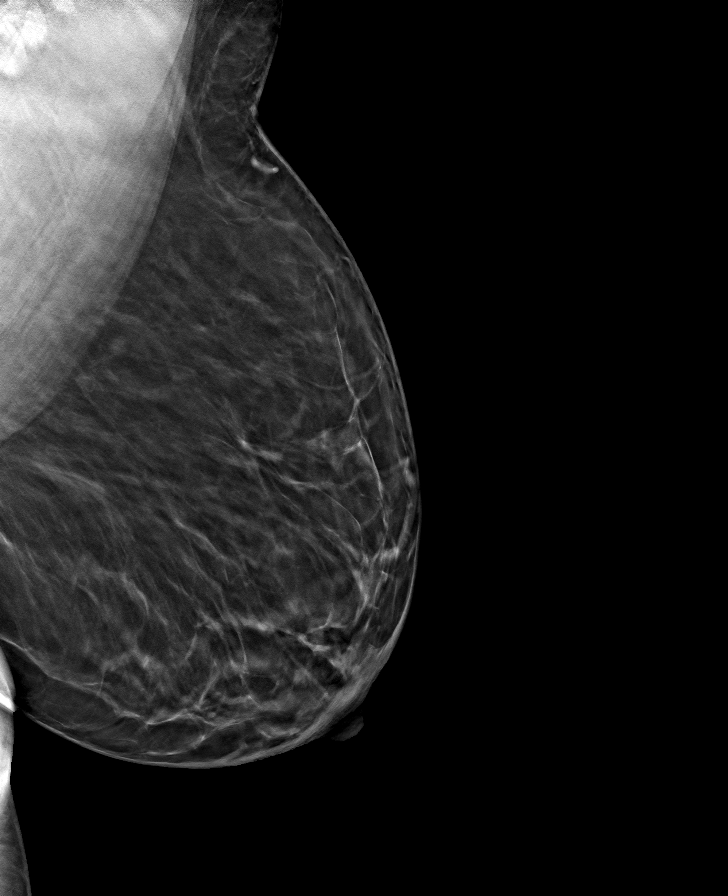

[L CC tomo · tomo slice 38/75.0]
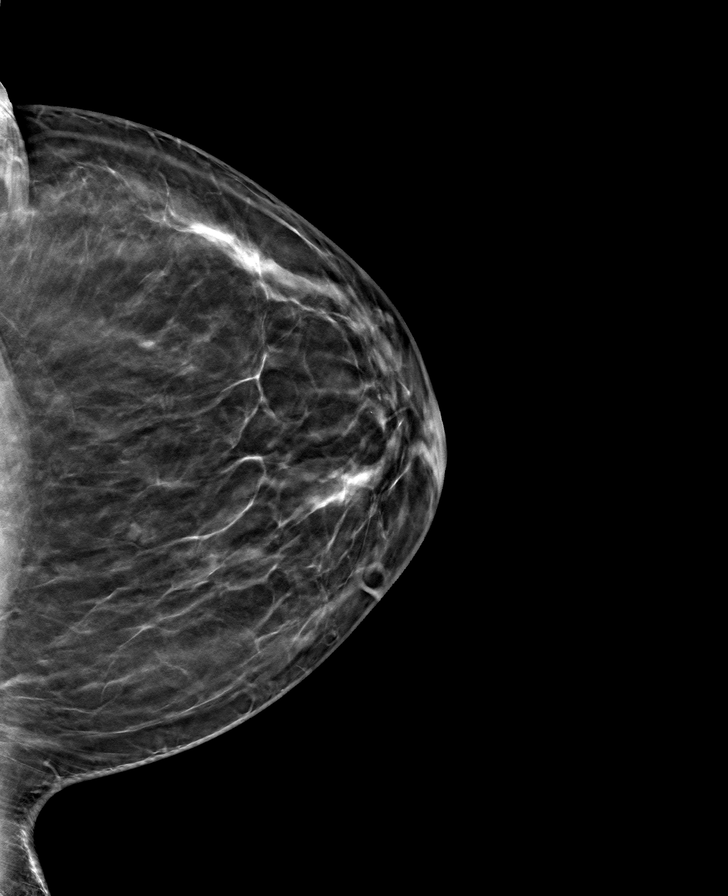

[R MLO tomo · tomo slice 39/78.0]
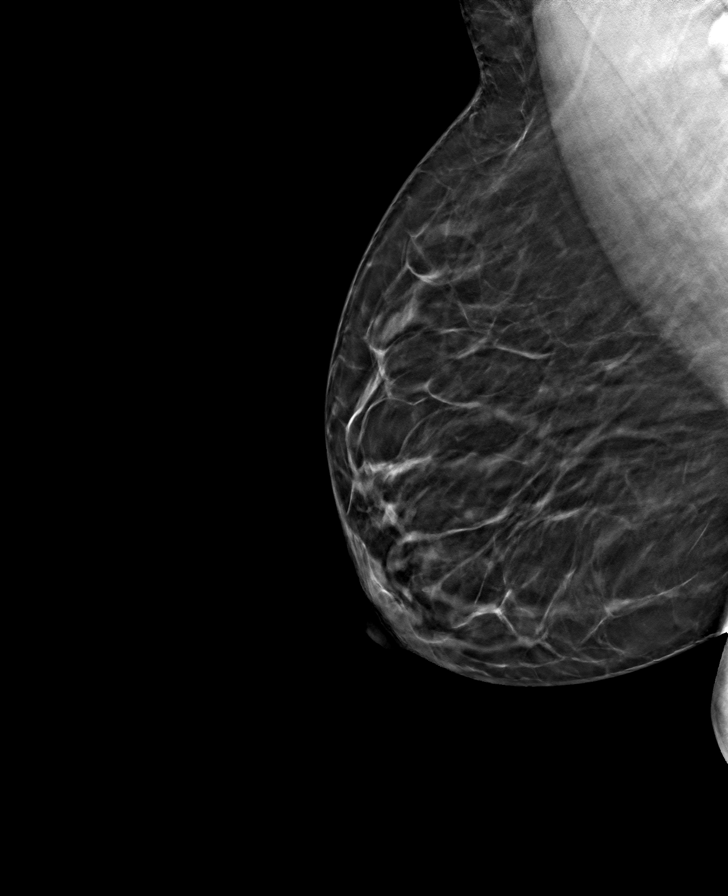

[R CC tomo · tomo slice 37/74.0]
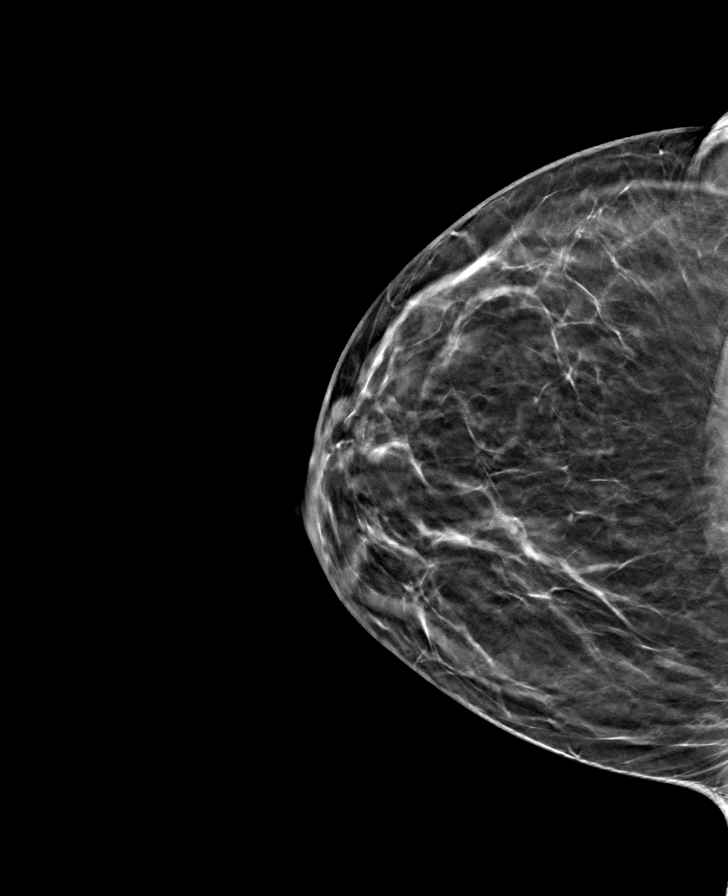

[8 of 24 positions shown; findings below may reference images not displayed]

ACR Breast Density Category b: There are scattered areas of
fibroglandular density.
FINDINGS: There are no findings suspicious for malignancy.
IMPRESSION: No mammographic evidence of malignancy. A result letter of this
screening mammogram will be mailed directly to the patient.

RECOMMENDATION:
Screening mammogram in one year. (Code:XG-X-X7B)

BI-RADS CATEGORY  1: Negative.

## 2023-02-02 DIAGNOSIS — Z419 Encounter for procedure for purposes other than remedying health state, unspecified: Secondary | ICD-10-CM | POA: Diagnosis not present

## 2023-03-05 DIAGNOSIS — Z419 Encounter for procedure for purposes other than remedying health state, unspecified: Secondary | ICD-10-CM | POA: Diagnosis not present

## 2023-03-31 ENCOUNTER — Other Ambulatory Visit: Payer: Self-pay

## 2023-03-31 ENCOUNTER — Emergency Department (HOSPITAL_COMMUNITY)
Admission: EM | Admit: 2023-03-31 | Discharge: 2023-03-31 | Discharge status: Against Medical Advice | Payer: Medicaid Other | Attending: Emergency Medicine | Admitting: Emergency Medicine

## 2023-03-31 ENCOUNTER — Encounter (HOSPITAL_COMMUNITY): Payer: Self-pay | Admitting: Emergency Medicine

## 2023-03-31 DIAGNOSIS — R1030 Lower abdominal pain, unspecified: Secondary | ICD-10-CM | POA: Diagnosis not present

## 2023-03-31 DIAGNOSIS — Z5321 Procedure and treatment not carried out due to patient leaving prior to being seen by health care provider: Secondary | ICD-10-CM | POA: Insufficient documentation

## 2023-03-31 DIAGNOSIS — R109 Unspecified abdominal pain: Secondary | ICD-10-CM | POA: Diagnosis not present

## 2023-03-31 MED ORDER — IBUPROFEN 800 MG PO TABS
800.0000 mg | ORAL_TABLET | Freq: Once | ORAL | Status: AC
  Administered 2023-03-31: 800 mg via ORAL
  Filled 2023-03-31: qty 1

## 2023-03-31 NOTE — ED Notes (Signed)
Pt stated she wanted to leave and not wait. Pt was seen leaving the ed

## 2023-03-31 NOTE — ED Provider Triage Note (Signed)
Emergency Medicine Provider Triage Evaluation Note  Connie Benson , a 43 y.o. female  was evaluated in triage.  Pt complains of R flank pain x 1 day.  Review of Systems  Positive: Pain, wraps around to front, worse with breathing/movement Negative: N/V/D, dysuria, hematuria, injury, fever, chills  Physical Exam  BP (!) 138/90 (BP Location: Right Arm)   Pulse 92   Temp 98.1 F (36.7 C) (Oral)   Resp 18   Wt 124 kg   SpO2 100%   BMI 44.12 kg/m  Gen:   Awake, no distress   Resp:  Normal effort  MSK:   Moves extremities without difficulty  Other:    Medical Decision Making  Medically screening exam initiated at 6:48 PM.  Appropriate orders placed.  Connie Benson was informed that the remainder of the evaluation will be completed by another provider, this initial triage assessment does not replace that evaluation, and the importance of remaining in the ED until their evaluation is complete.  Pt given ibuprofen in triage   Connie Jenny, PA-C 03/31/23 1850

## 2023-03-31 NOTE — ED Triage Notes (Signed)
Presents for R Flank and R rib pain that started yesterday and is getting worse over time.  Worse with deep breath and palpation Denies dysuria, frequency, lower abd pain, recent heavy lifting, cough, fever, N/V LMP started today  Tried motrin without relief.  H/o anemia and sickle cell trait only

## 2023-04-05 DIAGNOSIS — Z419 Encounter for procedure for purposes other than remedying health state, unspecified: Secondary | ICD-10-CM | POA: Diagnosis not present

## 2023-04-14 DIAGNOSIS — Z114 Encounter for screening for human immunodeficiency virus [HIV]: Secondary | ICD-10-CM | POA: Diagnosis not present

## 2023-04-14 DIAGNOSIS — Z0000 Encounter for general adult medical examination without abnormal findings: Secondary | ICD-10-CM | POA: Diagnosis not present

## 2023-04-14 DIAGNOSIS — Z13220 Encounter for screening for lipoid disorders: Secondary | ICD-10-CM | POA: Diagnosis not present

## 2023-04-14 DIAGNOSIS — Z8632 Personal history of gestational diabetes: Secondary | ICD-10-CM | POA: Diagnosis not present

## 2023-04-14 DIAGNOSIS — Z1231 Encounter for screening mammogram for malignant neoplasm of breast: Secondary | ICD-10-CM | POA: Diagnosis not present

## 2023-04-14 DIAGNOSIS — Z833 Family history of diabetes mellitus: Secondary | ICD-10-CM | POA: Diagnosis not present

## 2023-04-29 DIAGNOSIS — E049 Nontoxic goiter, unspecified: Secondary | ICD-10-CM | POA: Diagnosis not present

## 2023-05-02 DIAGNOSIS — E042 Nontoxic multinodular goiter: Secondary | ICD-10-CM | POA: Diagnosis not present

## 2023-05-03 DIAGNOSIS — Z419 Encounter for procedure for purposes other than remedying health state, unspecified: Secondary | ICD-10-CM | POA: Diagnosis not present

## 2023-05-13 DIAGNOSIS — Z124 Encounter for screening for malignant neoplasm of cervix: Secondary | ICD-10-CM | POA: Diagnosis not present

## 2023-05-19 DIAGNOSIS — E042 Nontoxic multinodular goiter: Secondary | ICD-10-CM | POA: Diagnosis not present

## 2023-06-14 DIAGNOSIS — Z419 Encounter for procedure for purposes other than remedying health state, unspecified: Secondary | ICD-10-CM | POA: Diagnosis not present

## 2023-07-14 DIAGNOSIS — Z419 Encounter for procedure for purposes other than remedying health state, unspecified: Secondary | ICD-10-CM | POA: Diagnosis not present

## 2023-08-14 DIAGNOSIS — Z419 Encounter for procedure for purposes other than remedying health state, unspecified: Secondary | ICD-10-CM | POA: Diagnosis not present

## 2023-09-13 DIAGNOSIS — Z419 Encounter for procedure for purposes other than remedying health state, unspecified: Secondary | ICD-10-CM | POA: Diagnosis not present

## 2023-10-14 DIAGNOSIS — Z419 Encounter for procedure for purposes other than remedying health state, unspecified: Secondary | ICD-10-CM | POA: Diagnosis not present

## 2023-10-27 DIAGNOSIS — R7303 Prediabetes: Secondary | ICD-10-CM | POA: Diagnosis not present

## 2023-10-27 DIAGNOSIS — F4320 Adjustment disorder, unspecified: Secondary | ICD-10-CM | POA: Diagnosis not present

## 2023-10-27 DIAGNOSIS — D649 Anemia, unspecified: Secondary | ICD-10-CM | POA: Diagnosis not present

## 2023-10-27 DIAGNOSIS — R030 Elevated blood-pressure reading, without diagnosis of hypertension: Secondary | ICD-10-CM | POA: Diagnosis not present

## 2023-10-27 DIAGNOSIS — M722 Plantar fascial fibromatosis: Secondary | ICD-10-CM | POA: Diagnosis not present

## 2023-11-14 DIAGNOSIS — Z419 Encounter for procedure for purposes other than remedying health state, unspecified: Secondary | ICD-10-CM | POA: Diagnosis not present

## 2023-12-03 DIAGNOSIS — E042 Nontoxic multinodular goiter: Secondary | ICD-10-CM | POA: Diagnosis not present

## 2023-12-14 DIAGNOSIS — Z419 Encounter for procedure for purposes other than remedying health state, unspecified: Secondary | ICD-10-CM | POA: Diagnosis not present

## 2024-02-13 DIAGNOSIS — Z419 Encounter for procedure for purposes other than remedying health state, unspecified: Secondary | ICD-10-CM | POA: Diagnosis not present

## 2024-03-01 DIAGNOSIS — M722 Plantar fascial fibromatosis: Secondary | ICD-10-CM | POA: Diagnosis not present

## 2024-03-01 DIAGNOSIS — R030 Elevated blood-pressure reading, without diagnosis of hypertension: Secondary | ICD-10-CM | POA: Diagnosis not present

## 2024-03-01 DIAGNOSIS — R7303 Prediabetes: Secondary | ICD-10-CM | POA: Diagnosis not present
